# Patient Record
Sex: Female | Born: 1995 | Race: Black or African American | Hispanic: No | Marital: Single | Smoking: Former smoker
Health system: Southern US, Community
[De-identification: ages and names within clinical notes are randomized; demographics above are authoritative.]

## PROBLEM LIST (undated history)

## (undated) DIAGNOSIS — M549 Dorsalgia, unspecified: Secondary | ICD-10-CM

## (undated) DIAGNOSIS — R519 Headache, unspecified: Secondary | ICD-10-CM

## (undated) DIAGNOSIS — M2550 Pain in unspecified joint: Secondary | ICD-10-CM

## (undated) DIAGNOSIS — G932 Benign intracranial hypertension: Secondary | ICD-10-CM

## (undated) DIAGNOSIS — I10 Essential (primary) hypertension: Secondary | ICD-10-CM

## (undated) DIAGNOSIS — J302 Other seasonal allergic rhinitis: Secondary | ICD-10-CM

## (undated) HISTORY — DX: Pain in unspecified joint: M25.50

## (undated) HISTORY — DX: Benign intracranial hypertension: G93.2

## (undated) HISTORY — DX: Dorsalgia, unspecified: M54.9

## (undated) HISTORY — PX: LAPAROSCOPIC REVISION VENTRICULAR-PERITONEAL (V-P) SHUNT: SHX5924

---
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
## 2018-04-23 ENCOUNTER — Telehealth: Payer: Self-pay | Admitting: Neurology

## 2018-04-23 ENCOUNTER — Ambulatory Visit: Payer: BLUE CROSS/BLUE SHIELD | Admitting: Neurology

## 2018-04-23 NOTE — Telephone Encounter (Signed)
This patient canceled the same day of a new patient appointment. 

## 2018-04-24 ENCOUNTER — Encounter: Payer: Self-pay | Admitting: Neurology

## 2018-05-26 ENCOUNTER — Telehealth: Payer: Self-pay

## 2018-05-26 NOTE — Telephone Encounter (Signed)
Pt has a new pt appt scheduled for 06/17/18 left a vm requesting a call back to verify if she would be interested in doing a virtual video visit.

## 2018-06-16 NOTE — Addendum Note (Signed)
Addended by: Ann Maki T on: 06/16/2018 10:00 AM   Modules accepted: Orders

## 2018-06-16 NOTE — Telephone Encounter (Addendum)
I was able to reach the pt in regards to her 06/17/18 NP appt.   Pt was advised, due to current COVID 19 pandemic, our office is severely reducing in office visits for at least the next 2 weeks, in order to minimize the risk to our patients and healthcare providers.   Pt was offered a video visit on 06/17/18 at 8:30 am and accepted.   Pt understands that although there may be some limitations with this type of visit, we will take all precautions to reduce any security or privacy concerns.  Pt understands that this will be treated like an in office visit and we will file with pt's insurance, and there may be a patient responsible charge related to this service.   Pt's email is [email or web address]. Pt understands that the [employer] webex software must be downloaded and operational on the device pt plans to use for the visit.  Pt's medications, allergies and PMH have been updated.   Patient was referred by Jaymes Graff for elevated BP and history of VP shunt.

## 2018-06-17 ENCOUNTER — Ambulatory Visit (INDEPENDENT_AMBULATORY_CARE_PROVIDER_SITE_OTHER): Payer: BLUE CROSS/BLUE SHIELD | Admitting: Neurology

## 2018-06-17 ENCOUNTER — Other Ambulatory Visit: Payer: Self-pay

## 2018-06-17 ENCOUNTER — Encounter: Payer: Self-pay | Admitting: Neurology

## 2018-06-17 DIAGNOSIS — G932 Benign intracranial hypertension: Secondary | ICD-10-CM

## 2018-06-17 NOTE — Progress Notes (Signed)
     Virtual Visit via Telephone Note  I connected with Megan Wade on 06/17/18 at  8:30 AM EDT by telephone and verified that I am speaking with the correct person using two identifiers.   I discussed the limitations, risks, security and privacy concerns of performing an evaluation and management service by telephone and the availability of in person appointments. I also discussed with the patient that there may be a patient responsible charge related to this service. The patient expressed understanding and agreed to proceed.  The patient is at home, physician in the office.   History of Present Illness: Megan Wade is a 23 year old right-handed black female with a history of pseudotumor cerebri.  The patient was seen by Dr. Sharene Skeans in 2013.  At that time, the patient had extremely high opening pressures with a pressure greater than 50, she was beginning to lose vision, she was having double vision and severe headaches.  She was on birth control pills at that time.  The patient claims that she weighed about 210 pounds at that point.  She was referred to [facility] and underwent a VP shunt placement which alleviated her symptoms.  She was taken off of the birth control pills.  She has not had any recurrence of her symptoms since that time.  She does have some residual double vision that occurs while reading, otherwise she does not have double vision.  She reports no headaches at this time, she has no muffled hearing or neck stiffness.  She reports no difficulty with speech or swallowing or any dizziness.  She denies any numbness or weakness of the face, arms, legs.  There have been no problems with balance.  She is sent to this office for an evaluation.   Observations/Objective: The WebEx evaluation reveals that the patient is bright and alert.  She is answering questions appropriately.  Speech is well enunciated, not aphasic or dysarthric.  Extraocular movements are full.  The patient has ability to  protrude the tongue midline with good lateral movements of the tongue.  Good facial symmetry is seen.  The patient has good finger-nose-finger and heel shin bilaterally.  Gait is normal.  Tandem gait is normal.  Romberg is negative.  Assessment and Plan: 1.  History of pseudotumor cerebri, status post VP shunt placement  The patient is doing quite well since the VP shunt placement, she has not had any symptoms in about 7 years.  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  The patient does not require any medical therapy at this point.  She will contact our office if she believes that she is having new symptoms of headache, muffled hearing, or neck stiffness.  Follow Up Instructions: Follow-up to this office if needed.   I discussed the assessment and treatment plan with the patient. The patient was provided an opportunity to ask questions and all were answered. The patient agreed with the plan and demonstrated an understanding of the instructions.   The patient was advised to call back or seek an in-person evaluation if the symptoms worsen or if the condition fails to improve as anticipated.  I provided 30 minutes of non-face-to-face time during this encounter.   York Spaniel, MD

## 2020-01-05 ENCOUNTER — Emergency Department (HOSPITAL_COMMUNITY): Payer: Managed Care, Other (non HMO)

## 2020-01-05 ENCOUNTER — Emergency Department (HOSPITAL_COMMUNITY)
Admission: EM | Admit: 2020-01-05 | Discharge: 2020-01-06 | Disposition: A | Discharge status: Home / Self Care | Payer: Managed Care, Other (non HMO) | Attending: Emergency Medicine | Admitting: Emergency Medicine

## 2020-01-05 ENCOUNTER — Other Ambulatory Visit: Payer: Self-pay

## 2020-01-05 ENCOUNTER — Encounter (HOSPITAL_COMMUNITY): Payer: Self-pay

## 2020-01-05 DIAGNOSIS — F17290 Nicotine dependence, other tobacco product, uncomplicated: Secondary | ICD-10-CM | POA: Insufficient documentation

## 2020-01-05 DIAGNOSIS — S29012A Strain of muscle and tendon of back wall of thorax, initial encounter: Secondary | ICD-10-CM | POA: Insufficient documentation

## 2020-01-05 DIAGNOSIS — S29019A Strain of muscle and tendon of unspecified wall of thorax, initial encounter: Secondary | ICD-10-CM

## 2020-01-05 DIAGNOSIS — S299XXA Unspecified injury of thorax, initial encounter: Secondary | ICD-10-CM | POA: Diagnosis present

## 2020-01-05 LAB — POC URINE PREG, ED: Preg Test, Ur: NEGATIVE
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
## 2020-01-05 NOTE — Discharge Instructions (Addendum)
Apply ice to sore areas as needed.  Take ibuprofen and/or acetaminophen as needed for pain.

## 2020-09-01 ENCOUNTER — Other Ambulatory Visit: Payer: Self-pay

## 2020-09-01 ENCOUNTER — Emergency Department (HOSPITAL_COMMUNITY)
Admission: EM | Admit: 2020-09-01 | Discharge: 2020-09-01 | Disposition: A | Discharge status: Home / Self Care | Payer: Managed Care, Other (non HMO) | Attending: Student | Admitting: Student

## 2020-09-01 ENCOUNTER — Emergency Department (HOSPITAL_COMMUNITY): Payer: Managed Care, Other (non HMO)

## 2020-09-01 ENCOUNTER — Encounter (HOSPITAL_COMMUNITY): Payer: Self-pay | Admitting: Emergency Medicine

## 2020-09-01 DIAGNOSIS — R519 Headache, unspecified: Secondary | ICD-10-CM

## 2020-09-01 DIAGNOSIS — F17290 Nicotine dependence, other tobacco product, uncomplicated: Secondary | ICD-10-CM | POA: Insufficient documentation

## 2020-09-01 DIAGNOSIS — U071 COVID-19: Secondary | ICD-10-CM | POA: Diagnosis not present

## 2020-09-01 LAB — CBC WITH DIFFERENTIAL/PLATELET
Abs Immature Granulocytes: 0.04 10*3/uL (ref 0.00–0.07)
Basophils Absolute: 0 10*3/uL (ref 0.0–0.1)
Basophils Relative: 0 %
Eosinophils Absolute: 0 10*3/uL (ref 0.0–0.5)
Eosinophils Relative: 0 %
HCT: 40 % (ref 36.0–46.0)
Hemoglobin: 13.1 g/dL (ref 12.0–15.0)
Immature Granulocytes: 1 %
Lymphocytes Relative: 6 %
Lymphs Abs: 0.3 10*3/uL — ABNORMAL LOW (ref 0.7–4.0)
MCH: 30 pg (ref 26.0–34.0)
MCHC: 32.8 g/dL (ref 30.0–36.0)
MCV: 91.5 fL (ref 80.0–100.0)
Monocytes Absolute: 0.5 10*3/uL (ref 0.1–1.0)
Monocytes Relative: 9 %
Neutro Abs: 4.5 10*3/uL (ref 1.7–7.7)
Neutrophils Relative %: 84 %
Platelets: 217 10*3/uL (ref 150–400)
RBC: 4.37 MIL/uL (ref 3.87–5.11)
RDW: 14.2 % (ref 11.5–15.5)
WBC: 5.3 10*3/uL (ref 4.0–10.5)
nRBC: 0 % (ref 0.0–0.2)

## 2020-09-01 LAB — RESP PANEL BY RT-PCR (FLU A&B, COVID) ARPGX2
Influenza A by PCR: NEGATIVE
Influenza B by PCR: NEGATIVE
SARS Coronavirus 2 by RT PCR: POSITIVE — AB

## 2020-09-01 LAB — I-STAT BETA HCG BLOOD, ED (MC, WL, AP ONLY): I-stat hCG, quantitative: <5 m[IU]/mL (ref <5)

## 2020-09-01 LAB — BASIC METABOLIC PANEL
Anion gap: 10 (ref 5–15)
BUN: 8 mg/dL (ref 6–20)
CO2: 24 mmol/L (ref 22–32)
Calcium: 8.9 mg/dL (ref 8.9–10.3)
Chloride: 96 mmol/L — ABNORMAL LOW (ref 98–111)
Creatinine, Ser: 0.71 mg/dL (ref 0.44–1.00)
GFR, Estimated: >60 mL/min (ref >60)
Glucose, Bld: 100 mg/dL — ABNORMAL HIGH (ref 70–99)
Potassium: 3.9 mmol/L (ref 3.5–5.1)
Sodium: 130 mmol/L — ABNORMAL LOW (ref 135–145)

## 2020-09-01 MED ORDER — ACETAMINOPHEN 500 MG PO TABS
1000.0000 mg | ORAL_TABLET | Freq: Once | ORAL | Status: AC
  Administered 2020-09-01: 1000 mg via ORAL
  Filled 2020-09-01: qty 2

## 2020-09-01 MED ORDER — ACETAMINOPHEN 500 MG PO TABS: 1000.0000 mg | ORAL_TABLET | Freq: Four times a day (QID) | ORAL | 0 refills | Status: DC | PRN

## 2020-09-01 NOTE — Discharge Instructions (Addendum)
Your headache is likely due to COVID infection.  Please follow instruction below.  Recommendations for at home COVID-19 symptoms management:  Please continue isolation at home. Call [phone] to see whether you might be eligible for therapeutic antibody infusions (leave your name and they will call you back).  If have acute worsening of symptoms please go to ER/urgent care for further evaluation. Check pulse oximetry and if below 90-92% please go to ER. The following supplements MAY help:  Vitamin C 500mg  twice a day and Quercetin 250-500 mg twice a day Vitamin D3 2000 - 4000 u/day B Complex vitamins Zinc 75-100 mg/day Melatonin 6-10 mg at night (the optimal dose is unknown) Aspirin 81mg /day (if no history of bleeding issues)

## 2020-09-01 NOTE — ED Provider Notes (Signed)
Emergency Medicine Provider Triage Evaluation Note  Megan Wade , a 25 y.o. female  was evaluated in triage.  Pt complains of headache that started earlier today. Patient states headache is pressure behind bilateral eyes. She has a VP shunt that was placed about 12 years ago. No complications. Denies speech changes, unilateral weakness, and dizziness. She notes she feels warm but no documented fever. No COVID exposures.  Review of Systems  Positive: headache Negative: CP  Physical Exam  BP (!) 160/148 (BP Location: Right Arm)   Temp 98.2 F (36.8 C) (Oral)   Resp 18   Ht 5\' 9"  (1.753 m)   Wt 124.7 kg   SpO2 100%   BMI 40.61 kg/m  Gen:   Awake, no distress   Resp:  Normal effort  MSK:   Moves extremities without difficulty  Other:    Medical Decision Making  Medically screening exam initiated at 7:54 PM.  Appropriate orders placed.  Megan Wade was informed that the remainder of the evaluation will be completed by another provider, this initial triage assessment does not replace that evaluation, and the importance of remaining in the ED until their evaluation is complete.  VP shunt series to rule out complications Labs/ COVID test   Megan Wade 09/01/20 09/03/20, MD 09/04/20 [phone]

## 2020-09-01 NOTE — ED Triage Notes (Signed)
Pt reports "feeling bad" all day  Pressure behind eyes.  Hypertensive in triage.  States she has hx of htn but is not on any meds.  States she has "slept all day"  Symptoms began this morning.

## 2020-09-01 NOTE — ED Provider Notes (Addendum)
[facility] EMERGENCY DEPARTMENT Provider Note   CSN: [id number] Arrival date & time: 09/01/20  1854     History Chief Complaint  Patient presents with   Headache    Megan Wade is a 25 y.o. female.    The history is provided by the patient. No language interpreter was used.  Headache  25 year old female significant history of pseudotumor cerebri with a VP shunt that was placed 12 years ago who presents for evaluation of headache.  Patient endorsed she developed gradual onset of pain to her forehead and behind both eyes.  Pain is sharp throbbing persistent mild to moderate in severity.  She endorsed feeling a bit feverish.  She denies any double vision loss of vision runny nose sneezing coughing sore throat chest pain trouble breathing abdominal pain vomiting or diarrhea dysuria.  No specific treatment tried.  No recent sick exposure.  Past Medical History:  Diagnosis Date   Pseudotumor cerebri    Dr Josephina Gip at [facility]    Patient Active Problem List   Diagnosis Date Noted   Pseudotumor cerebri 06/17/2018    Past Surgical History:  Procedure Laterality Date   LAPAROSCOPIC REVISION VENTRICULAR-PERITONEAL (V-P) SHUNT       OB History   No obstetric history on file.     Family History  Problem Relation Age of Onset   Hypertension Mother    Hypertension Father    Cancer Maternal Grandfather    Diabetes Paternal Grandfather     Social History   Tobacco Use   Smoking status: Every Day    Types: Cigars   Smokeless tobacco: Never  Vaping Use   Vaping Use: Never used  Substance Use Topics   Alcohol use: Yes    Comment: Social    Drug use: Never    Home Medications Prior to Admission medications   Not on File    Allergies    Patient has no known allergies.  Review of Systems   Review of Systems  Neurological:  Positive for headaches.  All other systems reviewed and are negative.  Physical Exam Updated Vital Signs BP (!)  161/97 (BP Location: Right Arm)   Pulse 82   Temp 98.2 F (36.8 C) (Oral)   Resp 16   Ht 5\' 9"  (1.753 m)   Wt 124.7 kg   SpO2 100%   BMI 40.61 kg/m   Physical Exam Vitals and nursing note reviewed.  Constitutional:      General: She is not in acute distress.    Appearance: She is well-developed. She is obese.  HENT:     Head: Normocephalic and atraumatic.     Mouth/Throat:     Mouth: Mucous membranes are moist.     Pharynx: Oropharynx is clear.  Eyes:     Extraocular Movements: Extraocular movements intact.     Conjunctiva/sclera: Conjunctivae normal.     Pupils: Pupils are equal, round, and reactive to light.  Cardiovascular:     Rate and Rhythm: Normal rate and regular rhythm.     Heart sounds: Normal heart sounds.  Pulmonary:     Effort: Pulmonary effort is normal.  Abdominal:     Palpations: Abdomen is soft.  Musculoskeletal:     Cervical back: Normal range of motion and neck supple. No rigidity.  Lymphadenopathy:     Cervical: No cervical adenopathy.  Skin:    Findings: No rash.  Neurological:     Mental Status: She is alert and oriented to person, place,  and time.     GCS: GCS eye subscore is 4. GCS verbal subscore is 5. GCS motor subscore is 6.     Cranial Nerves: No cranial nerve deficit, dysarthria or facial asymmetry.     Sensory: No sensory deficit.     Motor: No weakness.  Psychiatric:        Mood and Affect: Mood normal.    ED Results / Procedures / Treatments   Labs (all labs ordered are listed, but only abnormal results are displayed) Labs Reviewed  RESP PANEL BY RT-PCR (FLU A&B, COVID) ARPGX2 - Abnormal; Notable for the following components:      Result Value   SARS Coronavirus 2 by RT PCR POSITIVE (*)    All other components within normal limits  CBC WITH DIFFERENTIAL/PLATELET - Abnormal; Notable for the following components:   Lymphs Abs 0.3 (*)    All other components within normal limits  BASIC METABOLIC PANEL - Abnormal; Notable for the  following components:   Sodium 130 (*)    Chloride 96 (*)    Glucose, Bld 100 (*)    All other components within normal limits  I-STAT BETA HCG BLOOD, ED ([facility], [facility], [facility] ONLY)    EKG None  Radiology DG Skull 1-3 Views  Result Date: 09/01/2020 CLINICAL DATA:  Evaluate for shunt malfunction. EXAM: SKULL - 1-3 VIEW COMPARISON:  01/05/2020 FINDINGS: Two view exam of the skull shows no evidence for discontinuity of the shunt catheter tubing. No evidence for skull fracture. Visualized paranasal sinuses appear clear. IMPRESSION: No evidence for discontinuity of the shunt catheter tubing. Electronically Signed   By: Kennith Center M.D.   On: 09/01/2020 21:04   DG Chest 1 View  Result Date: 09/01/2020 CLINICAL DATA:  25 year old female with VP shunt malfunction. EXAM: CHEST  1 VIEW; ABDOMEN - 1 VIEW COMPARISON:  Chest radiograph dated 01/05/2020. FINDINGS: VP shunt catheter noted over the right side of the chest and abdomen with tip in the right lower quadrant. No kinking or breakage noted in the visualized catheter. The lungs are clear. There is no pleural effusion pneumothorax. Mild cardiomegaly. No bowel dilatation or evidence of obstruction. No free air or radiopaque calculi identified. The osseous structures are intact. The soft tissues are unremarkable. IMPRESSION: Unremarkable visualized VP shunt catheter. Electronically Signed   By: Elgie Collard M.D.   On: 09/01/2020 21:04   DG Abd 1 View  Result Date: 09/01/2020 CLINICAL DATA:  25 year old female with VP shunt malfunction. EXAM: CHEST  1 VIEW; ABDOMEN - 1 VIEW COMPARISON:  Chest radiograph dated 01/05/2020. FINDINGS: VP shunt catheter noted over the right side of the chest and abdomen with tip in the right lower quadrant. No kinking or breakage noted in the visualized catheter. The lungs are clear. There is no pleural effusion pneumothorax. Mild cardiomegaly. No bowel dilatation or evidence of obstruction. No free air or radiopaque calculi  identified. The osseous structures are intact. The soft tissues are unremarkable. IMPRESSION: Unremarkable visualized VP shunt catheter. Electronically Signed   By: Elgie Collard M.D.   On: 09/01/2020 21:04    Procedures Procedures   Medications Ordered in ED Medications  acetaminophen (TYLENOL) tablet 1,000 mg (has no administration in time range)    ED Course  I have reviewed the triage vital signs and the nursing notes.  Pertinent labs & imaging results that were available during my care of the patient were reviewed by me and considered in my medical decision making (see chart for details).  MDM Rules/Calculators/A&P                          BP (!) 161/97 (BP Location: Right Arm)   Pulse 82   Temp 98.2 F (36.8 C) (Oral)   Resp 16   Ht 5\' 9"  (1.753 m)   Wt 124.7 kg   SpO2 100%   BMI 40.61 kg/m   Final Clinical Impression(s) / ED Diagnoses Final diagnoses:  Bad headache  COVID-19 virus infection    Rx / DC Orders ED Discharge Orders          Ordered    acetaminophen (TYLENOL) 500 MG tablet  Every 6 hours PRN        09/01/20 2327           11:24 PM Patient here with complaints of headache and subjective fever.  Patient overall well-appearing no nuchal rigidity concerning for meningitis.  She has no focal neurodeficit on exam concerning for stroke or space-occupying lesion.  No vision changes to suggest worsening of pseudotumor cerebri.  Screening for VP shunt including skull x-ray and chest x-ray without any concerning finding.  Patient did test positive for COVID-19.  She has not been vaccinated for COVID-19.  Suspect headache likely secondary to COVID infection.  Appropriate instruction provided with return precaution  Anneth Brunell was evaluated in Emergency Department on 09/01/2020 for the symptoms described in the history of present illness. She was evaluated in the context of the global COVID-19 pandemic, which necessitated consideration that the patient  might be at risk for infection with the SARS-CoV-2 virus that causes COVID-19. Institutional protocols and algorithms that pertain to the evaluation of patients at risk for COVID-19 are in a state of rapid change based on information released by regulatory bodies including the CDC and federal and state organizations. These policies and algorithms were followed during the patient's care in the ED.    09/03/2020, PA-C 09/01/20 2328    09/03/20, PA-C 09/01/20 2331    09/03/20, MD 09/02/20 [phone]

## 2021-06-24 ENCOUNTER — Emergency Department (HOSPITAL_COMMUNITY): Payer: Managed Care, Other (non HMO)

## 2021-06-24 ENCOUNTER — Encounter (HOSPITAL_COMMUNITY): Payer: Self-pay | Admitting: Emergency Medicine

## 2021-06-24 ENCOUNTER — Emergency Department (HOSPITAL_COMMUNITY)
Admission: EM | Admit: 2021-06-24 | Discharge: 2021-06-24 | Disposition: A | Discharge status: Home / Self Care | Payer: Managed Care, Other (non HMO) | Attending: Emergency Medicine | Admitting: Emergency Medicine

## 2021-06-24 DIAGNOSIS — R519 Headache, unspecified: Secondary | ICD-10-CM

## 2021-06-24 DIAGNOSIS — Z79899 Other long term (current) drug therapy: Secondary | ICD-10-CM | POA: Diagnosis not present

## 2021-06-24 DIAGNOSIS — R000 Tachycardia, unspecified: Secondary | ICD-10-CM | POA: Diagnosis not present

## 2021-06-24 DIAGNOSIS — J019 Acute sinusitis, unspecified: Secondary | ICD-10-CM | POA: Diagnosis not present

## 2021-06-24 DIAGNOSIS — I10 Essential (primary) hypertension: Secondary | ICD-10-CM | POA: Insufficient documentation

## 2021-06-24 LAB — BASIC METABOLIC PANEL
Anion gap: 7 (ref 5–15)
BUN: 8 mg/dL (ref 6–20)
CO2: 25 mmol/L (ref 22–32)
Calcium: 9.4 mg/dL (ref 8.9–10.3)
Chloride: 103 mmol/L (ref 98–111)
Creatinine, Ser: 0.76 mg/dL (ref 0.44–1.00)
GFR, Estimated: >60 mL/min (ref >60)
Glucose, Bld: 101 mg/dL — ABNORMAL HIGH (ref 70–99)
Potassium: 3.8 mmol/L (ref 3.5–5.1)
Sodium: 135 mmol/L (ref 135–145)

## 2021-06-24 LAB — TSH: TSH: 3.837 u[IU]/mL (ref 0.350–4.500)

## 2021-06-24 LAB — CBC WITH DIFFERENTIAL/PLATELET
Abs Immature Granulocytes: 0.03 10*3/uL (ref 0.00–0.07)
Basophils Absolute: 0 10*3/uL (ref 0.0–0.1)
Basophils Relative: 1 %
Eosinophils Absolute: 0.1 10*3/uL (ref 0.0–0.5)
Eosinophils Relative: 1 %
HCT: 41.8 % (ref 36.0–46.0)
Hemoglobin: 14.1 g/dL (ref 12.0–15.0)
Immature Granulocytes: 1 %
Lymphocytes Relative: 31 %
Lymphs Abs: 2 10*3/uL (ref 0.7–4.0)
MCH: 29.7 pg (ref 26.0–34.0)
MCHC: 33.7 g/dL (ref 30.0–36.0)
MCV: 88.2 fL (ref 80.0–100.0)
Monocytes Absolute: 0.4 10*3/uL (ref 0.1–1.0)
Monocytes Relative: 6 %
Neutro Abs: 4 10*3/uL (ref 1.7–7.7)
Neutrophils Relative %: 60 %
Platelets: 285 10*3/uL (ref 150–400)
RBC: 4.74 MIL/uL (ref 3.87–5.11)
RDW: 14.5 % (ref 11.5–15.5)
WBC: 6.5 10*3/uL (ref 4.0–10.5)
nRBC: 0 % (ref 0.0–0.2)

## 2021-06-24 LAB — I-STAT BETA HCG BLOOD, ED (MC, WL, AP ONLY): I-stat hCG, quantitative: <5 m[IU]/mL (ref <5)

## 2021-06-24 MED ORDER — CLONIDINE HCL 0.1 MG PO TABS
0.1000 mg | ORAL_TABLET | Freq: Once | ORAL | Status: AC
  Administered 2021-06-24: 0.1 mg via ORAL
  Filled 2021-06-24: qty 1

## 2021-06-24 MED ORDER — AMLODIPINE BESYLATE 5 MG PO TABS: 5.0000 mg | ORAL_TABLET | Freq: Every day | ORAL | 0 refills | Status: DC

## 2021-06-24 MED ORDER — FLUTICASONE PROPIONATE 50 MCG/ACT NA SUSP: 2.0000 | Freq: Every day | NASAL | 0 refills | Status: DC

## 2021-06-24 MED ORDER — SODIUM CHLORIDE 0.9 % IV BOLUS: 1000.0000 mL | Freq: Once | INTRAVENOUS | Status: DC

## 2021-06-24 MED ORDER — DEXAMETHASONE SODIUM PHOSPHATE 10 MG/ML IJ SOLN
10.0000 mg | Freq: Once | INTRAMUSCULAR | Status: AC
  Administered 2021-06-24: 10 mg via INTRAVENOUS
  Filled 2021-06-24: qty 1

## 2021-06-24 MED ORDER — CETIRIZINE HCL 10 MG PO TABS: 10.0000 mg | ORAL_TABLET | Freq: Every day | ORAL | 0 refills | Status: DC

## 2021-06-24 MED ORDER — METOCLOPRAMIDE HCL 5 MG/ML IJ SOLN
10.0000 mg | Freq: Once | INTRAMUSCULAR | Status: AC
  Administered 2021-06-24: 10 mg via INTRAVENOUS
  Filled 2021-06-24: qty 2

## 2021-06-24 MED ORDER — DIPHENHYDRAMINE HCL 50 MG/ML IJ SOLN
50.0000 mg | Freq: Once | INTRAMUSCULAR | Status: AC
  Administered 2021-06-24: 50 mg via INTRAVENOUS
  Filled 2021-06-24: qty 1

## 2021-06-24 NOTE — ED Notes (Signed)
Patient given sandwich and water

## 2021-06-24 NOTE — ED Triage Notes (Signed)
Patient c/o headache x6 days and hypertensive at home 185/120. Denies taking BP medications. ?

## 2021-06-24 NOTE — Discharge Instructions (Signed)
Follow-up with your OB/GYN within the next 1 to 2 weeks for recheck.  You may need to establish care with a primary care doctor to better manage her blood pressure.  Uses Zyrtec and Flonase as instructed.  Return to the emergency room if you have any worsening symptoms including fevers, worsening headache, vomiting, or other worsening symptoms. ?

## 2021-06-24 NOTE — ED Provider Notes (Signed)
?[facility]-EMERGENCY DEPT ?Provider Note ? ? ?CSN: [id number] ?Arrival date & time: 06/24/21  1617 ? ?  ? ?History ? ?Chief Complaint  ?Patient presents with  ? Hypertension  ? ? ?Megan Wade is a 26 y.o. female. ? ?Patient is a 26 year old female with a history of prior pseudotumor cerebri with VP shunt placement in 2013.  She presents with a 1 week history of ongoing headaches.  She said it was bifrontal and was bilateral previously but now is more on the right side.  She said she used a nasal rinse because she had some nasal congestion that seemed to improve the pain on the left side but she still has pain on the right side.  She says she woke up this morning it was very severe but has been waxing waning throughout the day.  She has no associated fevers.  No neck pain or stiffness.  No nausea or vomiting.  She does have a little bit of photophobia.  No history of similar symptoms in the past.  No vision changes.  No speech deficits.  No numbness or weakness to her extremities.  She has noted that her blood pressure has been elevated at home.  She does not have a known history of hypertension nor does she take blood pressure medicines. ? ? ?  ? ?Home Medications ?Prior to Admission medications   ?Medication Sig Start Date End Date Taking? Authorizing Provider  ?amLODipine (NORVASC) 5 MG tablet Take 1 tablet (5 mg total) by mouth daily. 06/24/21  Yes Rolan Bucco, MD  ?cetirizine (ZYRTEC ALLERGY) 10 MG tablet Take 1 tablet (10 mg total) by mouth daily. 06/24/21  Yes Rolan Bucco, MD  ?fluticasone (FLONASE) 50 MCG/ACT nasal spray Place 2 sprays into both nostrils daily. 06/24/21  Yes Rolan Bucco, MD  ?acetaminophen (TYLENOL) 500 MG tablet Take 2 tablets (1,000 mg total) by mouth every 6 (six) hours as needed for fever or headache. 09/01/20   Fayrene Helper, PA-C  ?   ? ?Allergies    ?Patient has no known allergies.   ? ?Review of Systems   ?Review of Systems  ?Constitutional:  Negative for chills,  diaphoresis, fatigue and fever.  ?HENT:  Positive for congestion. Negative for rhinorrhea and sneezing.   ?Eyes:  Positive for photophobia. Negative for visual disturbance.  ?Respiratory:  Negative for cough, chest tightness and shortness of breath.   ?Cardiovascular:  Negative for chest pain and leg swelling.  ?Gastrointestinal:  Negative for abdominal pain, blood in stool, diarrhea, nausea and vomiting.  ?Genitourinary:  Negative for difficulty urinating, flank pain, frequency and hematuria.  ?Musculoskeletal:  Negative for arthralgias and back pain.  ?Skin:  Negative for rash.  ?Neurological:  Positive for headaches. Negative for dizziness, speech difficulty, weakness and numbness.  ? ?Physical Exam ?Updated Vital Signs ?BP (!) 152/109   Pulse (!) 104   Temp 98.5 ?F (36.9 ?C) (Oral)   Resp (!) 23   LMP 05/21/2021   SpO2 100%  ?Physical Exam ?Constitutional:   ?   Appearance: She is well-developed.  ?HENT:  ?   Head: Normocephalic and atraumatic.  ?Eyes:  ?   Pupils: Pupils are equal, round, and reactive to light.  ?Neck:  ?   Comments: No meningismus ?Cardiovascular:  ?   Rate and Rhythm: Normal rate and regular rhythm.  ?   Heart sounds: Normal heart sounds.  ?Pulmonary:  ?   Effort: Pulmonary effort is normal. No respiratory distress.  ?   Breath  sounds: Normal breath sounds. No wheezing or rales.  ?Chest:  ?   Chest wall: No tenderness.  ?Abdominal:  ?   General: Bowel sounds are normal.  ?   Palpations: Abdomen is soft.  ?   Tenderness: There is no abdominal tenderness. There is no guarding or rebound.  ?Musculoskeletal:     ?   General: Normal range of motion.  ?   Cervical back: Normal range of motion and neck supple.  ?Lymphadenopathy:  ?   Cervical: No cervical adenopathy.  ?Skin: ?   General: Skin is warm and dry.  ?   Findings: No rash.  ?Neurological:  ?   General: No focal deficit present.  ?   Mental Status: She is alert and oriented to person, place, and time.  ?   Comments: Motor 5/5 all  extremities ?Sensation grossly intact to LT all extremities ?Finger to Nose intact, no pronator drift ?CN II-XII grossly intact ? ?  ? ? ?ED Results / Procedures / Treatments   ?Labs ?(all labs ordered are listed, but only abnormal results are displayed) ?Labs Reviewed  ?BASIC METABOLIC PANEL - Abnormal; Notable for the following components:  ?    Result Value  ? Glucose, Bld 101 (*)   ? All other components within normal limits  ?CBC WITH DIFFERENTIAL/PLATELET  ?TSH  ?I-STAT BETA HCG BLOOD, ED ([facility], [facility], [facility] ONLY)  ? ? ?EKG ?None ? ?Radiology ?DG Chest 1 View ? ?Result Date: 06/24/2021 ?CLINICAL DATA:  Evaluate for shunt malfunction.  Headache. EXAM: CHEST  1 VIEW; ABDOMEN - 1 VIEW; DG CERVICAL SPINE - 1 VIEW COMPARISON:  None Available. FINDINGS: Cervical spine: Shunt catheter tubing is seen in the right side of the neck coursing into the right chest. There is no discontinuity or kink. Chest: Shunt catheter tubing is seen projecting over the right chest coursing into the abdomen. There is no discontinuity or kink. Clear lungs. Normal heart size. Abdomen: Shunt catheter tubing courses into the right abdomen with tip in the right pelvis. There is no discontinuity or kink. Normal bowel gas pattern without obstruction. IMPRESSION: Intact shunt catheter tubing without evidence of discontinuity or kink. Tip of the catheter is in the right pelvis. Electronically Signed   By: Narda RutherfordMelanie  Sanford M.D.   On: 06/24/2021 18:13  ? ?DG Cervical Spine 1 View ? ?Result Date: 06/24/2021 ?CLINICAL DATA:  Evaluate for shunt malfunction.  Headache. EXAM: CHEST  1 VIEW; ABDOMEN - 1 VIEW; DG CERVICAL SPINE - 1 VIEW COMPARISON:  None Available. FINDINGS: Cervical spine: Shunt catheter tubing is seen in the right side of the neck coursing into the right chest. There is no discontinuity or kink. Chest: Shunt catheter tubing is seen projecting over the right chest coursing into the abdomen. There is no discontinuity or kink. Clear lungs. Normal  heart size. Abdomen: Shunt catheter tubing courses into the right abdomen with tip in the right pelvis. There is no discontinuity or kink. Normal bowel gas pattern without obstruction. IMPRESSION: Intact shunt catheter tubing without evidence of discontinuity or kink. Tip of the catheter is in the right pelvis. Electronically Signed   By: Narda RutherfordMelanie  Sanford M.D.   On: 06/24/2021 18:13  ? ?DG Abd 1 View ? ?Result Date: 06/24/2021 ?CLINICAL DATA:  Evaluate for shunt malfunction.  Headache. EXAM: CHEST  1 VIEW; ABDOMEN - 1 VIEW; DG CERVICAL SPINE - 1 VIEW COMPARISON:  None Available. FINDINGS: Cervical spine: Shunt catheter tubing is seen in the right side of the  neck coursing into the right chest. There is no discontinuity or kink. Chest: Shunt catheter tubing is seen projecting over the right chest coursing into the abdomen. There is no discontinuity or kink. Clear lungs. Normal heart size. Abdomen: Shunt catheter tubing courses into the right abdomen with tip in the right pelvis. There is no discontinuity or kink. Normal bowel gas pattern without obstruction. IMPRESSION: Intact shunt catheter tubing without evidence of discontinuity or kink. Tip of the catheter is in the right pelvis. Electronically Signed   By: Narda Rutherford M.D.   On: 06/24/2021 18:13  ? ?CT Head Wo Contrast ? ?Result Date: 06/24/2021 ?CLINICAL DATA:  Hypertension with headache x6 days. EXAM: CT HEAD WITHOUT CONTRAST TECHNIQUE: Contiguous axial images were obtained from the base of the skull through the vertex without intravenous contrast. RADIATION DOSE REDUCTION: This exam was performed according to the departmental dose-optimization program which includes automated exposure control, adjustment of the mA and/or kV according to patient size and/or use of iterative reconstruction technique. COMPARISON:  November 01, 2011 FINDINGS: Brain: No evidence of acute infarction, hemorrhage, hydrocephalus, extra-axial collection or mass lesion/mass effect. A  ventriculostomy catheter is seen entering the a right frontal burr hole. Its distal tip is seen along the midline, in between the anterior horns of the lateral ventricles. Vascular: No hyperdense vessel or unexpecte

## 2021-06-26 ENCOUNTER — Encounter (HOSPITAL_BASED_OUTPATIENT_CLINIC_OR_DEPARTMENT_OTHER): Payer: Self-pay | Admitting: Plastic Surgery

## 2021-06-27 ENCOUNTER — Other Ambulatory Visit: Payer: Self-pay

## 2021-06-27 ENCOUNTER — Encounter (HOSPITAL_BASED_OUTPATIENT_CLINIC_OR_DEPARTMENT_OTHER): Payer: Self-pay | Admitting: Plastic Surgery

## 2021-06-27 NOTE — Progress Notes (Signed)
Pt's chart and past medical history reviewed with Dr Okey Dupre. Pt to come in for EKG & BP check at PAT appt prior to day of surgery. Pt will also need Neuro clearance,  last OV 06/17/18 with Dr Anne Hahn,  ? ?The pt  presented to ED on 06/24/21 with c/o headaches x several days. Pt c/o sinus pressure and also found to be hypertensive at that time.  ? ?Marisue Ivan at Dr Sherald Hess' office aware that due to pt's neuro history clearance will need to be obtained. Pre op call done with the pt and she also verbalized understanding of plan of care.  ?

## 2021-06-28 ENCOUNTER — Encounter (HOSPITAL_BASED_OUTPATIENT_CLINIC_OR_DEPARTMENT_OTHER)
Admission: RE | Admit: 2021-06-28 | Discharge: 2021-06-28 | Disposition: A | Discharge status: Home / Self Care | Payer: Managed Care, Other (non HMO) | Source: Ambulatory Visit | Attending: Plastic Surgery | Admitting: Plastic Surgery

## 2021-06-28 DIAGNOSIS — Z01810 Encounter for preprocedural cardiovascular examination: Secondary | ICD-10-CM | POA: Diagnosis present

## 2021-06-28 NOTE — Progress Notes (Signed)
Pt here today for PAT EKG and BP check. Results reviewed by Dr. Roanna Banning, ok to proceed as planned with surgery at [facility] pending neuro clearance.  Pt already aware of need for neuro clearance. ? ?

## 2021-06-30 ENCOUNTER — Ambulatory Visit: Payer: Self-pay | Admitting: Plastic Surgery

## 2021-06-30 MED ORDER — TRANEXAMIC ACID 1000 MG/10ML IV SOLN: 1000.0000 mg | INTRAVENOUS | Status: AC

## 2021-07-04 ENCOUNTER — Ambulatory Visit (HOSPITAL_BASED_OUTPATIENT_CLINIC_OR_DEPARTMENT_OTHER): Payer: Managed Care, Other (non HMO) | Admitting: Anesthesiology

## 2021-07-04 ENCOUNTER — Other Ambulatory Visit: Payer: Self-pay

## 2021-07-04 ENCOUNTER — Encounter (HOSPITAL_BASED_OUTPATIENT_CLINIC_OR_DEPARTMENT_OTHER): Payer: Self-pay | Admitting: Plastic Surgery

## 2021-07-04 ENCOUNTER — Ambulatory Visit (HOSPITAL_BASED_OUTPATIENT_CLINIC_OR_DEPARTMENT_OTHER)
Admission: RE | Admit: 2021-07-04 | Discharge: 2021-07-04 | Disposition: A | Discharge status: Home / Self Care | Payer: Managed Care, Other (non HMO) | Attending: Plastic Surgery | Admitting: Plastic Surgery

## 2021-07-04 ENCOUNTER — Encounter (HOSPITAL_BASED_OUTPATIENT_CLINIC_OR_DEPARTMENT_OTHER)
Admission: RE | Disposition: A | Discharge status: Home / Self Care | Payer: Self-pay | Source: Home / Self Care | Attending: Plastic Surgery

## 2021-07-04 DIAGNOSIS — Z6841 Body Mass Index (BMI) 40.0 and over, adult: Secondary | ICD-10-CM

## 2021-07-04 DIAGNOSIS — I10 Essential (primary) hypertension: Secondary | ICD-10-CM | POA: Diagnosis not present

## 2021-07-04 DIAGNOSIS — Z01818 Encounter for other preprocedural examination: Secondary | ICD-10-CM

## 2021-07-04 DIAGNOSIS — N62 Hypertrophy of breast: Secondary | ICD-10-CM

## 2021-07-04 HISTORY — PX: BREAST REDUCTION WITH MASTOPEXY: SHX6465

## 2021-07-04 HISTORY — DX: Other seasonal allergic rhinitis: J30.2

## 2021-07-04 HISTORY — DX: Headache, unspecified: R51.9

## 2021-07-04 HISTORY — DX: Essential (primary) hypertension: I10

## 2021-07-04 LAB — POCT PREGNANCY, URINE: Preg Test, Ur: NEGATIVE

## 2021-07-04 SURGERY — MAMMOPLASTY, REDUCTION, WITH MASTOPEXY
Anesthesia: General | Site: Breast | Laterality: Bilateral

## 2021-07-04 MED ORDER — CHLORHEXIDINE GLUCONATE CLOTH 2 % EX PADS: 6.0000 | MEDICATED_PAD | Freq: Once | CUTANEOUS | Status: DC

## 2021-07-04 MED ORDER — ONDANSETRON HCL 4 MG/2ML IJ SOLN
INTRAMUSCULAR | Status: AC
  Filled 2021-07-04: qty 8

## 2021-07-04 MED ORDER — LIDOCAINE HCL (CARDIAC) PF 100 MG/5ML IV SOSY
PREFILLED_SYRINGE | INTRAVENOUS | Status: DC | PRN
Start: 2021-07-04 — End: 2021-07-04
  Administered 2021-07-04: 60 mg via INTRAVENOUS

## 2021-07-04 MED ORDER — DEXMEDETOMIDINE (PRECEDEX) IN NS 20 MCG/5ML (4 MCG/ML) IV SYRINGE
PREFILLED_SYRINGE | INTRAVENOUS | Status: AC
  Filled 2021-07-04: qty 5

## 2021-07-04 MED ORDER — 0.9 % SODIUM CHLORIDE (POUR BTL) OPTIME
TOPICAL | Status: DC | PRN
  Administered 2021-07-04: 1000 mL

## 2021-07-04 MED ORDER — BACITRACIN ZINC 500 UNIT/GM EX OINT
TOPICAL_OINTMENT | CUTANEOUS | Status: DC | PRN
  Administered 2021-07-04: 1 via TOPICAL

## 2021-07-04 MED ORDER — PHENYLEPHRINE 80 MCG/ML (10ML) SYRINGE FOR IV PUSH (FOR BLOOD PRESSURE SUPPORT)
PREFILLED_SYRINGE | INTRAVENOUS | Status: AC
  Filled 2021-07-04: qty 20

## 2021-07-04 MED ORDER — OXYCODONE HCL 5 MG PO TABS: 5.0000 mg | ORAL_TABLET | Freq: Once | ORAL | Status: DC | PRN

## 2021-07-04 MED ORDER — CEFAZOLIN IN SODIUM CHLORIDE 3-0.9 GM/100ML-% IV SOLN
INTRAVENOUS | Status: AC
  Filled 2021-07-04: qty 100

## 2021-07-04 MED ORDER — HYDROMORPHONE HCL 1 MG/ML IJ SOLN
INTRAMUSCULAR | Status: AC
  Filled 2021-07-04: qty 0.5

## 2021-07-04 MED ORDER — PROPOFOL 10 MG/ML IV BOLUS
INTRAVENOUS | Status: DC | PRN
Start: 2021-07-04 — End: 2021-07-04
  Administered 2021-07-04: 150 mg via INTRAVENOUS

## 2021-07-04 MED ORDER — LACTATED RINGERS IV SOLN: INTRAVENOUS | Status: DC

## 2021-07-04 MED ORDER — FENTANYL CITRATE (PF) 100 MCG/2ML IJ SOLN
INTRAMUSCULAR | Status: AC
  Filled 2021-07-04: qty 2

## 2021-07-04 MED ORDER — PROMETHAZINE HCL 25 MG/ML IJ SOLN: 6.2500 mg | INTRAMUSCULAR | Status: DC | PRN

## 2021-07-04 MED ORDER — MIDAZOLAM HCL 2 MG/2ML IJ SOLN
INTRAMUSCULAR | Status: AC
  Filled 2021-07-04: qty 2

## 2021-07-04 MED ORDER — PROPOFOL 500 MG/50ML IV EMUL
INTRAVENOUS | Status: DC | PRN
  Administered 2021-07-04: 25 ug/kg/min via INTRAVENOUS

## 2021-07-04 MED ORDER — MEPERIDINE HCL 25 MG/ML IJ SOLN: 6.2500 mg | INTRAMUSCULAR | Status: DC | PRN

## 2021-07-04 MED ORDER — EPHEDRINE 5 MG/ML INJ
INTRAVENOUS | Status: AC
  Filled 2021-07-04: qty 10

## 2021-07-04 MED ORDER — OXYCODONE HCL 5 MG/5ML PO SOLN: 5.0000 mg | Freq: Once | ORAL | Status: DC | PRN

## 2021-07-04 MED ORDER — CEFAZOLIN SODIUM-DEXTROSE 2-4 GM/100ML-% IV SOLN: 2.0000 g | INTRAVENOUS | Status: DC

## 2021-07-04 MED ORDER — TRANEXAMIC ACID-NACL 1000-0.7 MG/100ML-% IV SOLN
INTRAVENOUS | Status: AC
  Filled 2021-07-04: qty 100

## 2021-07-04 MED ORDER — SODIUM CHLORIDE (PF) 0.9 % IJ SOLN
INTRAMUSCULAR | Status: DC | PRN
  Administered 2021-07-04: 60 mL

## 2021-07-04 MED ORDER — CEFAZOLIN IN SODIUM CHLORIDE 3-0.9 GM/100ML-% IV SOLN
3.0000 g | Freq: Once | INTRAVENOUS | Status: AC
Start: 2021-07-04 — End: 2021-07-04
  Administered 2021-07-04: 3 g via INTRAVENOUS

## 2021-07-04 MED ORDER — AMISULPRIDE (ANTIEMETIC) 5 MG/2ML IV SOLN: 10.0000 mg | Freq: Once | INTRAVENOUS | Status: DC | PRN

## 2021-07-04 MED ORDER — DEXAMETHASONE SODIUM PHOSPHATE 10 MG/ML IJ SOLN
INTRAMUSCULAR | Status: AC
  Filled 2021-07-04: qty 2

## 2021-07-04 MED ORDER — SODIUM CHLORIDE 0.9 % IV SOLN
INTRAVENOUS | Status: DC | PRN
  Administered 2021-07-04: 120 mL

## 2021-07-04 MED ORDER — HYDROMORPHONE HCL 1 MG/ML IJ SOLN
0.2500 mg | INTRAMUSCULAR | Status: DC | PRN
  Administered 2021-07-04 (×3): 0.5 mg via INTRAVENOUS

## 2021-07-04 MED ORDER — DEXAMETHASONE SODIUM PHOSPHATE 4 MG/ML IJ SOLN
INTRAMUSCULAR | Status: DC | PRN
  Administered 2021-07-04: 5 mg via INTRAVENOUS

## 2021-07-04 MED ORDER — LIDOCAINE 2% (20 MG/ML) 5 ML SYRINGE
INTRAMUSCULAR | Status: AC
  Filled 2021-07-04: qty 10

## 2021-07-04 MED ORDER — BUPIVACAINE LIPOSOME 1.3 % IJ SUSP
INTRAMUSCULAR | Status: AC
  Filled 2021-07-04: qty 20

## 2021-07-04 MED ORDER — PROPOFOL 500 MG/50ML IV EMUL
INTRAVENOUS | Status: AC
  Filled 2021-07-04: qty 200

## 2021-07-04 MED ORDER — MIDAZOLAM HCL 5 MG/5ML IJ SOLN
INTRAMUSCULAR | Status: DC | PRN
  Administered 2021-07-04: 2 mg via INTRAVENOUS

## 2021-07-04 MED ORDER — SUGAMMADEX SODIUM 200 MG/2ML IV SOLN
INTRAVENOUS | Status: DC | PRN
  Administered 2021-07-04: 200 mg via INTRAVENOUS

## 2021-07-04 MED ORDER — ROCURONIUM BROMIDE 10 MG/ML (PF) SYRINGE
PREFILLED_SYRINGE | INTRAVENOUS | Status: AC
  Filled 2021-07-04: qty 20

## 2021-07-04 MED ORDER — FENTANYL CITRATE (PF) 100 MCG/2ML IJ SOLN
INTRAMUSCULAR | Status: AC
Start: 2021-07-04 — End: ?
  Filled 2021-07-04: qty 2

## 2021-07-04 MED ORDER — ONDANSETRON HCL 4 MG/2ML IJ SOLN
INTRAMUSCULAR | Status: DC | PRN
  Administered 2021-07-04: 4 mg via INTRAVENOUS

## 2021-07-04 MED ORDER — ROCURONIUM BROMIDE 100 MG/10ML IV SOLN
INTRAVENOUS | Status: DC | PRN
  Administered 2021-07-04: 10 mg via INTRAVENOUS
  Administered 2021-07-04: 20 mg via INTRAVENOUS
  Administered 2021-07-04: 30 mg via INTRAVENOUS
  Administered 2021-07-04: 80 mg via INTRAVENOUS
  Administered 2021-07-04: 10 mg via INTRAVENOUS

## 2021-07-04 MED ORDER — FENTANYL CITRATE (PF) 100 MCG/2ML IJ SOLN
INTRAMUSCULAR | Status: DC | PRN
  Administered 2021-07-04 (×8): 50 ug via INTRAVENOUS

## 2021-07-04 SURGICAL SUPPLY — 68 items
BAG DECANTER FOR FLEXI CONT (MISCELLANEOUS) ×6 IMPLANT
BLADE KNIFE PERSONA 10 (BLADE) ×16 IMPLANT
BLADE KNIFE PERSONA 15 (BLADE) ×10 IMPLANT
BNDG GAUZE ELAST 4 BULKY (GAUZE/BANDAGES/DRESSINGS) ×8 IMPLANT
CANISTER SUCT 1200ML W/VALVE (MISCELLANEOUS) ×4 IMPLANT
CAP BOUFFANT 24 BLUE NURSES (PROTECTIVE WEAR) ×4 IMPLANT
COVER BACK TABLE 60X90IN (DRAPES) ×2 IMPLANT
COVER MAYO STAND STRL (DRAPES) ×4 IMPLANT
DRAIN CHANNEL 10F 3/8 F FF (DRAIN) ×8 IMPLANT
DRAPE LAPAROSCOPIC ABDOMINAL (DRAPES) ×4 IMPLANT
DRSG EMULSION OIL 3X3 NADH (GAUZE/BANDAGES/DRESSINGS) ×8 IMPLANT
DRSG PAD ABDOMINAL 8X10 ST (GAUZE/BANDAGES/DRESSINGS) ×12 IMPLANT
ELECT BLADE 4.0 EZ CLEAN MEGAD (MISCELLANEOUS)
ELECT REM PT RETURN 9FT ADLT (ELECTROSURGICAL) ×3
ELECTRODE BLDE 4.0 EZ CLN MEGD (MISCELLANEOUS) ×2 IMPLANT
ELECTRODE REM PT RTRN 9FT ADLT (ELECTROSURGICAL) ×3 IMPLANT
EVACUATOR SILICONE 100CC (DRAIN) ×8 IMPLANT
GAUZE SPONGE 4X4 12PLY STRL (GAUZE/BANDAGES/DRESSINGS) ×8 IMPLANT
GLOVE BIO SURGEON STRL SZ7 (GLOVE) ×4 IMPLANT
GLOVE BIOGEL PI IND STRL 7.0 (GLOVE) ×4 IMPLANT
GLOVE BIOGEL PI INDICATOR 7.0 (GLOVE) ×4
GLOVE SURG SS PI 7.0 STRL IVOR (GLOVE) ×6 IMPLANT
GOWN STRL REUS W/ TWL LRG LVL3 (GOWN DISPOSABLE) ×9 IMPLANT
GOWN STRL REUS W/TWL LRG LVL3 (GOWN DISPOSABLE) ×15
HIBICLENS CHG 4% 4OZ BTL (MISCELLANEOUS) ×8 IMPLANT
IV NS 250ML (IV SOLUTION)
IV NS 250ML BAXH (IV SOLUTION) ×2 IMPLANT
NDL HYPO 25X1 1.5 SAFETY (NEEDLE) ×6 IMPLANT
NDL KEITH (NEEDLE) IMPLANT
NDL SAFETY ECLIPSE 18X1.5 (NEEDLE) ×2 IMPLANT
NDL SPNL 18GX3.5 QUINCKE PK (NEEDLE) ×2 IMPLANT
NEEDLE HYPO 18GX1.5 SHARP (NEEDLE)
NEEDLE HYPO 25X1 1.5 SAFETY (NEEDLE) ×9 IMPLANT
NEEDLE KEITH (NEEDLE) IMPLANT
NEEDLE SPNL 18GX3.5 QUINCKE PK (NEEDLE) ×3 IMPLANT
NS IRRIG 1000ML POUR BTL (IV SOLUTION) ×10 IMPLANT
PACK BASIN DAY SURGERY FS (CUSTOM PROCEDURE TRAY) ×4 IMPLANT
PACK UNIVERSAL I (CUSTOM PROCEDURE TRAY) ×4 IMPLANT
PAD FOAM SILICONE BACKED (GAUZE/BANDAGES/DRESSINGS) ×2 IMPLANT
PENCIL SMOKE EVACUATOR (MISCELLANEOUS) ×2 IMPLANT
PIN SAFETY STERILE (MISCELLANEOUS) ×4 IMPLANT
SCRUB TECHNI CARE 4 OZ NO DYE (MISCELLANEOUS) ×2 IMPLANT
SHEET MEDIUM DRAPE 40X70 STRL (DRAPES) IMPLANT
SLEEVE SCD COMPRESS KNEE MED (STOCKING) ×4 IMPLANT
SPIKE FLUID TRANSFER (MISCELLANEOUS) ×4 IMPLANT
SPONGE T-LAP 18X18 ~~LOC~~+RFID (SPONGE) ×14 IMPLANT
STAPLER VISISTAT 35W (STAPLE) ×8 IMPLANT
STRIP CLOSURE SKIN 1/2X4 (GAUZE/BANDAGES/DRESSINGS) ×16 IMPLANT
SUT ETHILON 3 0 PS 1 (SUTURE) ×4 IMPLANT
SUT GORETEX TH-18 36 INCH (SUTURE) IMPLANT
SUT MNCRL AB 3-0 PS2 18 (SUTURE) ×18 IMPLANT
SUT MNCRL AB 4-0 PS2 18 (SUTURE) ×8 IMPLANT
SUT MON AB 5-0 PS2 18 (SUTURE) ×8 IMPLANT
SUT PROLENE 2 0 CT2 30 (SUTURE) ×4 IMPLANT
SUT PROLENE 3 0 PS 1 (SUTURE) ×8 IMPLANT
SUT VLOC 90 P-14 23 (SUTURE) ×8 IMPLANT
SYR 20ML LL LF (SYRINGE) ×4 IMPLANT
SYR BULB IRRIG 60ML STRL (SYRINGE) ×8 IMPLANT
SYR CONTROL 10ML LL (SYRINGE) ×8 IMPLANT
TAPE MEASURE VINYL STERILE (MISCELLANEOUS) ×4 IMPLANT
TOWEL GREEN STERILE FF (TOWEL DISPOSABLE) ×12 IMPLANT
TRAY DSU PREP LF (CUSTOM PROCEDURE TRAY) ×4 IMPLANT
TRAY FOL W/BAG SLVR 16FR STRL (SET/KITS/TRAYS/PACK) ×2 IMPLANT
TRAY FOLEY W/BAG SLVR 14FR LF (SET/KITS/TRAYS/PACK) ×2 IMPLANT
TRAY FOLEY W/BAG SLVR 16FR LF (SET/KITS/TRAYS/PACK)
TUBE CONNECTING 20X1/4 (TUBING) ×4 IMPLANT
UNDERPAD 30X36 HEAVY ABSORB (UNDERPADS AND DIAPERS) ×8 IMPLANT
YANKAUER SUCT BULB TIP NO VENT (SUCTIONS) ×4 IMPLANT

## 2021-07-04 NOTE — Anesthesia Postprocedure Evaluation (Signed)
Anesthesia Post Note ? ?Patient: Megan Wade ? ?Procedure(s) Performed: BILATERAL BREAST REDUCTION WITH MAMMOPLASTY (Bilateral: Breast) ? ?  ? ?Anesthesia Type: General ?Anesthetic complications: no ? ? ?No notable events documented. ? ?Last Vitals:  ?Vitals:  ? 07/04/21 1545 07/04/21 1600  ?BP: 136/81 121/81  ?Pulse: (!) 106 (!) 106  ?Resp: 20 19  ?Temp:    ?SpO2: 97% 96%  ?  ?Last Pain:  ?Vitals:  ? 07/04/21 1611  ?TempSrc:   ?PainSc: Asleep  ? ? ?  ?  ?  ?  ?  ?  ? ?[name] ? ? ? ? ?

## 2021-07-04 NOTE — H&P (Signed)
  H&P faxed to surgical center.  -History and Physical Reviewed  -Patient has been re-examined  -No change in the plan of care  Megan Wade    

## 2021-07-04 NOTE — Anesthesia Preprocedure Evaluation (Signed)
Anesthesia Evaluation  ?Patient identified by MRN, date of birth, ID band ?Patient awake ? ? ? ?Reviewed: ?Allergy & Precautions, NPO status , Patient's Chart, lab work & pertinent test results ? ?Airway ?Mallampati: II ? ?TM Distance: >3 FB ?Neck ROM: Full ? ? ? Dental ?no notable dental hx. ? ?  ?Pulmonary ?neg pulmonary ROS, former smoker,  ?  ?Pulmonary exam normal ?breath sounds clear to auscultation ? ? ? ? ? ? Cardiovascular ?hypertension, Pt. on medications ?negative cardio ROS ?Normal cardiovascular exam ?Rhythm:Regular Rate:Normal ? ? ?  ?Neuro/Psych ? Headaches, negative psych ROS  ? GI/Hepatic ?negative GI ROS, Neg liver ROS,   ?Endo/Other  ?Morbid obesity ? Renal/GU ?negative Renal ROS  ?negative genitourinary ?  ?Musculoskeletal ?negative musculoskeletal ROS ?(+)  ? Abdominal ?(+) + obese,   ?Peds ?negative pediatric ROS ?(+)  Hematology ?negative hematology ROS ?(+)   ?Anesthesia Other Findings ? ? Reproductive/Obstetrics ?negative OB ROS ? ?  ? ? ? ? ? ? ? ? ? ? ? ? ? ?  ?  ? ? ? ? ? ? ? ? ?Anesthesia Physical ?Anesthesia Plan ? ?ASA: 3 ? ?Anesthesia Plan: General  ? ?Post-op Pain Management: Dilaudid IV, Ofirmev IV (intra-op)* and Toradol IV (intra-op)*  ? ?Induction: Intravenous ? ?PONV Risk Score and Plan: 3 and Ondansetron, Dexamethasone, Midazolam and Treatment may vary due to age or medical condition ? ?Airway Management Planned: Oral ETT ? ?Additional Equipment:  ? ?Intra-op Plan:  ? ?Post-operative Plan: Extubation in OR ? ?Informed Consent: I have reviewed the patients History and Physical, chart, labs and discussed the procedure including the risks, benefits and alternatives for the proposed anesthesia with the patient or authorized representative who has indicated his/her understanding and acceptance.  ? ? ? ?Dental advisory given ? ?Plan Discussed with: CRNA ? ?Anesthesia Plan Comments:   ? ? ? ? ? ? ?Anesthesia Quick Evaluation ? ?

## 2021-07-04 NOTE — Anesthesia Postprocedure Evaluation (Signed)
Anesthesia Post Note ? ?Patient: Megan Wade ? ?Procedure(s) Performed: BILATERAL BREAST REDUCTION WITH MAMMOPLASTY (Bilateral: Breast) ? ?  ? ?Patient location during evaluation: PACU ?Anesthesia Type: General ?Level of consciousness: awake and alert ?Pain management: pain level controlled ?Vital Signs Assessment: post-procedure vital signs reviewed and stable ?Respiratory status: spontaneous breathing, nonlabored ventilation, respiratory function stable and patient connected to nasal cannula oxygen ?Cardiovascular status: blood pressure returned to baseline and stable ?Postop Assessment: no apparent nausea or vomiting ?Anesthetic complications: no ? ? ?No notable events documented. ? ?Last Vitals:  ?Vitals:  ? 07/04/21 1545 07/04/21 1600  ?BP: 136/81 121/81  ?Pulse: (!) 106 (!) 106  ?Resp: 20 19  ?Temp:    ?SpO2: 97% 96%  ?  ?Last Pain:  ?Vitals:  ? 07/04/21 1611  ?TempSrc:   ?PainSc: Asleep  ? ? ?  ?  ?  ?  ?  ?  ? ?[name] ? ? ? ? ?

## 2021-07-04 NOTE — Brief Op Note (Signed)
07/04/2021 ? ?2:32 PM ? ?PATIENT:  Megan Wade  26 y.o. female ? ?PRE-OPERATIVE DIAGNOSIS:  HYPERTROPHY OF BILATERAL BREASTS ? ?POST-OPERATIVE DIAGNOSIS:  HYPERTROPHY OF BILATERAL BREASTS ? ?PROCEDURE:  Procedure(s): ?BILATERAL BREAST REDUCTION WITH MAMMOPLASTY (Bilateral) ? ?SURGEON:  Surgeon(s) and Role: ?   * Lajune Perine, Audrea Muscat, MD - Primary ? ?ANESTHESIA:   general ? ?EBL:  70 mL  ? ?BLOOD ADMINISTERED:none ? ?DRAINS: (78F) Jackson-Pratt drain(s) with closed bulb suction in the Bilateral Breasts   ? ?LOCAL MEDICATIONS USED:  OTHER 1.3% Exparel (266 mgs. Total) ? ?SPECIMEN:  Source of Specimen:  Bilateral Breasts ? ?DISPOSITION OF SPECIMEN:  PATHOLOGY ? ?COUNTS:  YES ? ?DICTATION: .Note written in EPIC ? ?PLAN OF CARE: Discharge to home after PACU ? ?PATIENT DISPOSITION:  PACU - hemodynamically stable. ?  ?Delay start of Pharmacological VTE agent (>24hrs) due to surgical blood loss or risk of bleeding: not applicable ? ?

## 2021-07-04 NOTE — Op Note (Signed)
OPERATIVE REPORT ? ?07/04/2021 ? ?Megan Wade ? ?PREOPERATIVE DIAGNOSIS:  Bilateral Macromastia. ? ?POSTOPERATIVE DIAGNOSIS:  Bilateral Macromastia. ? ?PROCEDURE:  Bilateral Reduction Mammoplasties. ? ?ATTENDING SURGEON:  Youlanda Roys, MD ? ?ANESTHESIA:  General. ? ?ANESTHESIOLOGISTBrooke Dare, MD ? ?COMPLICATIONS:  None. ? ?INDICATIONS FOR THE PROCEDURE:  The patient is a 26 y.o. female who ?has Bilateral Macromastia that is clinically symptomatic.  She presents ?to undergo Bilateral Reduction Mammoplasties. ? ?DESCRIPTION OF PROCEDURE:  The patient was marked in preop holding area ?in a pattern of Wise for the future bilateral reduction mammoplasties. ?She was then taken back to the OR, placed on the table in supine ?position.  After adequate general anesthesia was obtained, the patient's ?chest was prepped with Techni-Care and draped in sterile fashion.  The ?bases of the breasts have been infiltrated with 1% lidocaine with ?epinephrine.  After adequate hemostasis and anesthesia taken effect, the ?procedure was begun. ? ?Both of the breast reductions were performed in the following similar ?manner.  The nipple-areolar complex was marked with a 45-mm nipple ?marker.  The skin was then incised and deepithelialized around the ?nipple-areolar complex down to the inframammary crease in the inferior ?pedicle pattern.  Next, the medial, superior, and lateral skin flaps ?were elevated down to the chest wall.  Excess fat and glandular tissue ?removed from the inferior pedicle.  The nipple-areolar complex was ?examined and found to be pink and viable.  The wound was irrigated with ?saline irrigation.  Meticulous hemostasis was obtained with the Bovie ?electrocautery.  Inferior pedicle was centralized using 3-0 Prolene ?suture.  A #10 JP flat fully fluted drain was placed into the wound. ?The skin flaps were brought together at the inverted T junction with a 2- ?0 Prolene suture.  The incisions were stapled for  temporary closure. ?The breasts compared and found to have good shape and symmetry.  The ?incisions were then closed from the medial aspect of the JP drain to the ?medial aspect of the [facility] incision by first placing a few 3-0 Monocryl ?sutures to tack together the dermal layer, and then both the dermal and ?cuticular layer were closed in a single layer using a 3-0 V-lock ?barbed suture.  Lateral to the JP drain incision was closed using 3-0 ?Monocryl in the dermal layer, followed by 3-0 Monocryl running ?intracuticular stitch on the skin.  The vertical limb of the Wise ?pattern was closed in the dermal layer using 3-0 Monocryl suture. ? ?The patient was placed in the upright position.  The future location of ?the nipple-areolar complexes was marked on both breast mounds using the ?45-mm nipple marker.  She was then placed back in the recumbent ?position. ? ?Both of the nipple areolar complexes were brought out onto the breast ?mounds in the following similar manner.  The skin was incised as marked ?and removed in full thickness into the subcutaneous tissues.  The nipple- ?areolar complex was examined, found to be pink and viable, then brought ?out through this aperture and sewn in place using 4-0 Monocryl in the ?dermal layer, followed by 5-0 Monocryl running intracuticular stitch on ?the skin.  This 5-0 Monocryl suture was then brought down to close the ?cuticular layer of the vertical limb as well.  The JP drain was sewn in ?place using 3-0 nylon suture.  The pectoralis major muscle and fascia ?along with the breast and chest soft tissues were then infiltrated with ?1% Exparel (total 266 mg).  Now the [facility] incision was also infiltrated ?with  the Exparel in order to give the patient postoperative pain ?control.  The incisions were dressed with Steri-Strips and the ?nipples dressed with Bacitracin ointment and Adaptic.  4x4s were placed ?over the incisions and ABD pads in the axillary areas.  The patient was ?placed  into a light postoperative support bra.  There were no complications. ?The patient tolerated the procedure well.  The final needle, sponge ?counts were reported to be correct at the end of the case. ? ?The patient was then recovered without complications.  Both the patient ?and her family were given proper postoperative wound care instructions. ?She was then discharged home in the care of her family in stable ?condition.  Follow up will be with me in a few days in the office. ? ? ? ?     ?Youlanda Roys, M.D. ? ?07/04/2021 ?2:36 PM ? ? ? ?  ?

## 2021-07-04 NOTE — Anesthesia Procedure Notes (Signed)
Procedure Name: Intubation ?Date/Time: 07/04/2021 9:52 AM ?Performed by: Signe Colt, CRNA ?Pre-anesthesia Checklist: Patient identified, Emergency Drugs available, Suction available and Patient being monitored ?Patient Re-evaluated:Patient Re-evaluated prior to induction ?Oxygen Delivery Method: Circle system utilized ?Preoxygenation: Pre-oxygenation with 100% oxygen ?Induction Type: IV induction ?Ventilation: Mask ventilation without difficulty ?Laryngoscope Size: Mac and 3 ?Grade View: Grade I ?Tube type: Oral ?Tube size: 7.0 mm ?Number of attempts: 1 ?Airway Equipment and Method: Stylet and Oral airway ?Placement Confirmation: ETT inserted through vocal cords under direct vision, positive ETCO2 and breath sounds checked- equal and bilateral ?Secured at: 21 cm ?Tube secured with: Tape ?Dental Injury: Teeth and Oropharynx as per pre-operative assessment  ? ? ? ? ?

## 2021-07-04 NOTE — Discharge Instructions (Addendum)
1. No lifting greater than 5 lbs with arms for 4 weeks. 2. Empty, strip, record and reactivate JP drains 3 times a day. 3. Percocet 5/325 mg tabs 1-2 tabs po q 4-6 hours prn pain- prescription given in office. 4. Duricef 1 tab po bid- prescription given in office. 5. Sterapred dose pack as directed- prescription given in office. 6. Follow-up appointment Friday in office.     Post Anesthesia Home Care Instructions  Activity: Get plenty of rest for the remainder of the day. A responsible individual must stay with you for 24 hours following the procedure.  For the next 24 hours, DO NOT: -Drive a car -Operate machinery -Drink alcoholic beverages -Take any medication unless instructed by your physician -Make any legal decisions or sign important papers.  Meals: Start with liquid foods such as gelatin or soup. Progress to regular foods as tolerated. Avoid greasy, spicy, heavy foods. If nausea and/or vomiting occur, drink only clear liquids until the nausea and/or vomiting subsides. Call your physician if vomiting continues.  Special Instructions/Symptoms: Your throat may feel dry or sore from the anesthesia or the breathing tube placed in your throat during surgery. If this causes discomfort, gargle with warm salt water. The discomfort should disappear within 24 hours.  If you had a scopolamine patch placed behind your ear for the management of post- operative nausea and/or vomiting:  1. The medication in the patch is effective for 72 hours, after which it should be removed.  Wrap patch in a tissue and discard in the trash. Wash hands thoroughly with soap and water. 2. You may remove the patch earlier than 72 hours if you experience unpleasant side effects which may include dry mouth, dizziness or visual disturbances. 3. Avoid touching the patch. Wash your hands with soap and water after contact with the patch.  Information for Discharge Teaching: EXPAREL (bupivacaine liposome injectable  suspension)   Your surgeon or anesthesiologist gave you EXPAREL(bupivacaine) to help control your pain after surgery.  EXPAREL is a local anesthetic that provides pain relief by numbing the tissue around the surgical site. EXPAREL is designed to release pain medication over time and can control pain for up to 72 hours. Depending on how you respond to EXPAREL, you may require less pain medication during your recovery.  Possible side effects: Temporary loss of sensation or ability to move in the area where bupivacaine was injected. Nausea, vomiting, constipation Rarely, numbness and tingling in your mouth or lips, lightheadedness, or anxiety may occur. Call your doctor right away if you think you may be experiencing any of these sensations, or if you have other questions regarding possible side effects.  Follow all other discharge instructions given to you by your surgeon or nurse. Eat a healthy diet and drink plenty of water or other fluids.  If you return to the hospital for any reason within 96 hours following the administration of EXPAREL, it is important for health care providers to know that you have received this anesthetic. A teal colored band has been placed on your arm with the date, time and amount of EXPAREL you have received in order to alert and inform your health care providers. Please leave this armband in place for the full 96 hours following administration, and then you may remove the band.          JP Drain Totals Bring this sheet to all of your post-operative appointments while you have your drains. Please measure your drains by CC's or ML's. Make sure   you drain and measure your JP Drains 3 times per day. At the end of each day, add up totals for the left side and add up totals for the right side.    ( 9 am )     ( 3 pm )        ( 9 pm )                Date L  R  L  R  L  R  Total L/R                                                                                                                                                                                        

## 2021-07-04 NOTE — Transfer of Care (Signed)
Immediate Anesthesia Transfer of Care Note ? ?Patient: Megan Wade ? ?Procedure(s) Performed: BILATERAL BREAST REDUCTION WITH MAMMOPLASTY (Bilateral: Breast) ? ?Patient Location: PACU ? ?Anesthesia Type:General ? ?Level of Consciousness: awake ? ?Airway & Oxygen Therapy: Patient Spontanous Breathing and Patient connected to face mask oxygen ? ?Post-op Assessment: Report given to RN and Post -op Vital signs reviewed and stable ? ?Post vital signs: Reviewed and stable ? ?Last Vitals:  ?Vitals Value Taken Time  ?BP    ?Temp    ?Pulse    ?Resp    ?SpO2    ? ? ?Last Pain:  ?Vitals:  ? 07/04/21 0757  ?TempSrc: Oral  ?PainSc: 0-No pain  ?   ? ?Patients Stated Pain Goal: 7 (07/04/21 0757) ? ?Complications: No notable events documented. ?

## 2021-07-05 ENCOUNTER — Encounter (HOSPITAL_BASED_OUTPATIENT_CLINIC_OR_DEPARTMENT_OTHER): Payer: Self-pay | Admitting: Plastic Surgery

## 2021-07-06 LAB — SURGICAL PATHOLOGY

## 2023-02-24 IMAGING — CT CT HEAD W/O CM
3 series · 15 of 47 positions shown, 18 images · non-contrast
Comparison: November 01, 2011

CLINICAL DATA: Hypertension with headache x6 days.



[Series 2: head wo · axial · 0.48mm/px · z∈[-185,-55]mm · 9 of 32 slices shown, 12 images]
[im 3/32  brain]
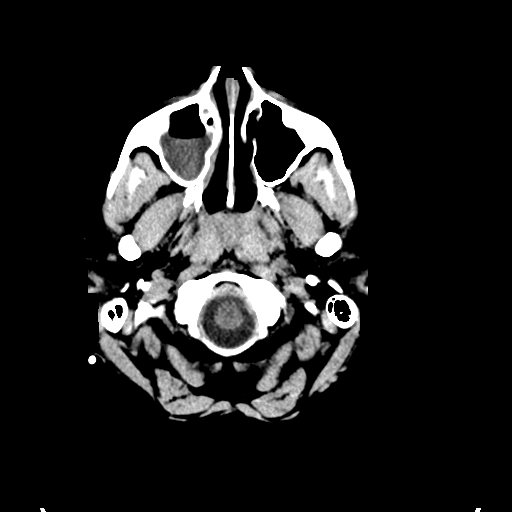
[im 3/32  bone]
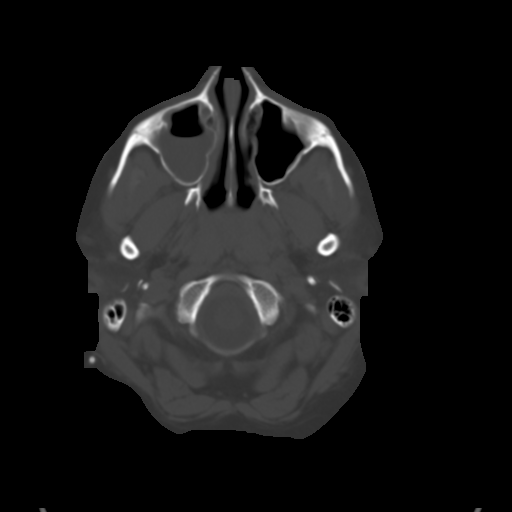
[im 6/32  brain]
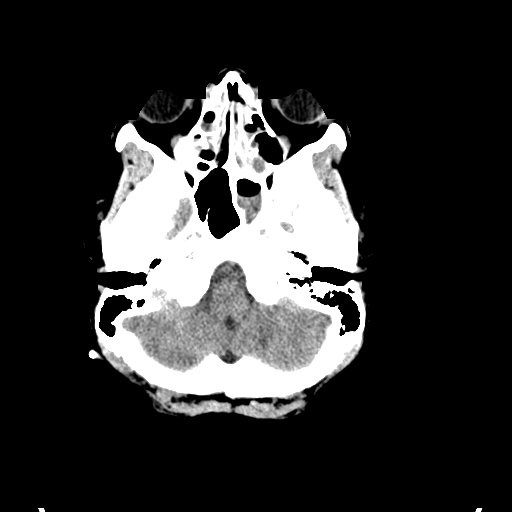
[im 9/32  brain]
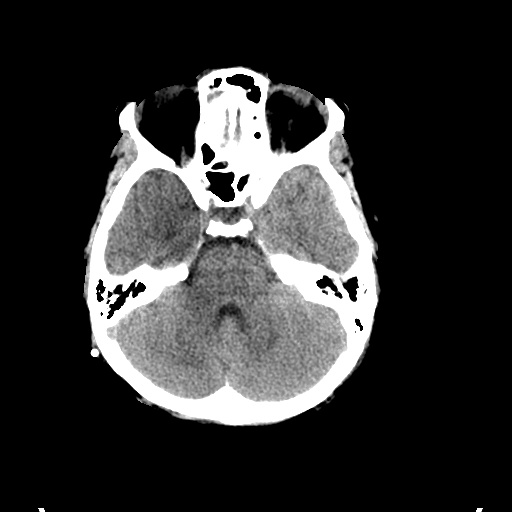
[im 12/32  brain]
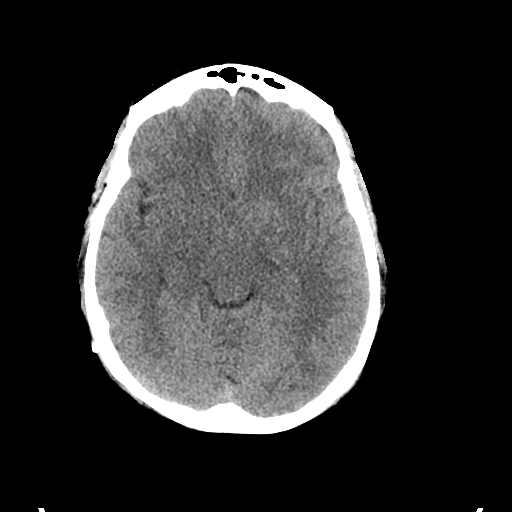
[im 17/32  brain]
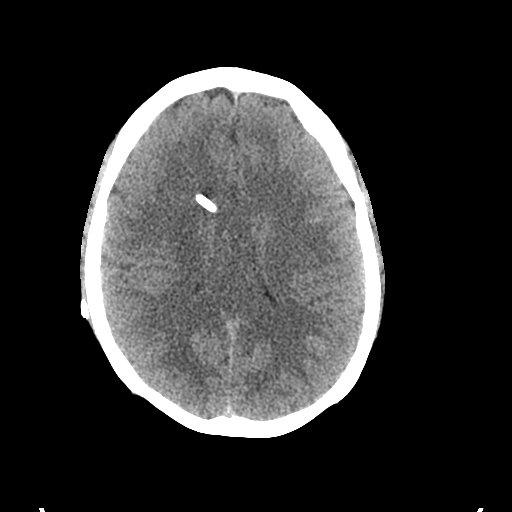
[im 17/32  bone]
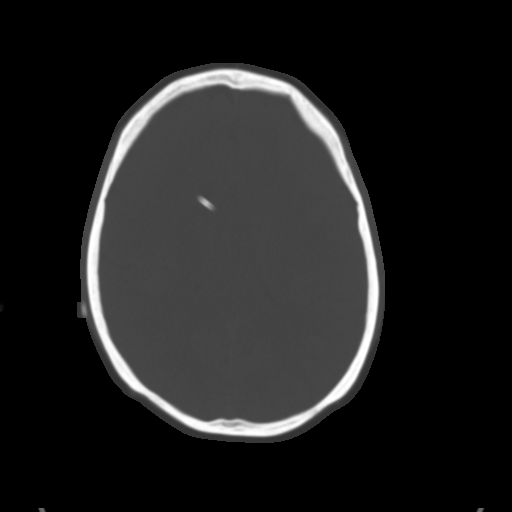
[im 20/32  brain]
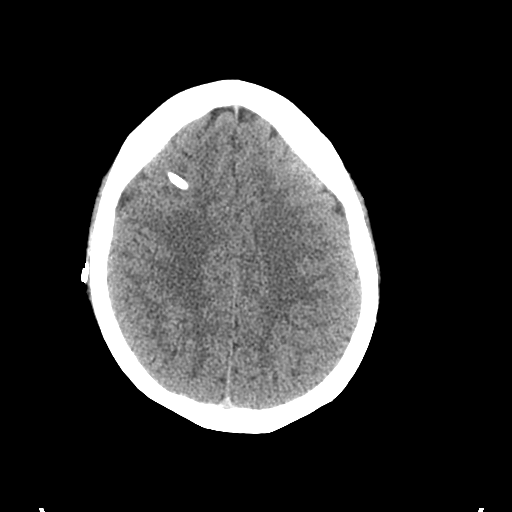
[im 23/32  brain]
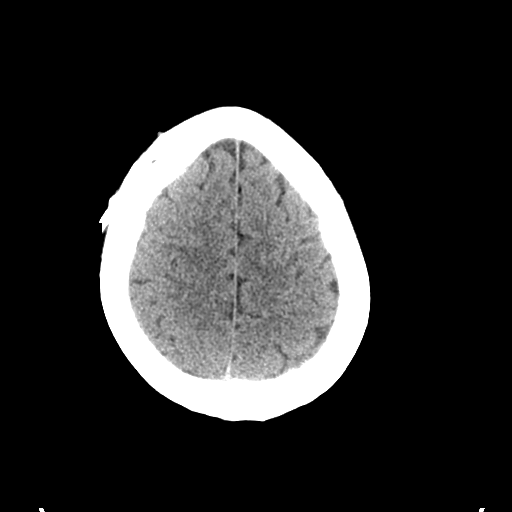
[im 26/32  brain]
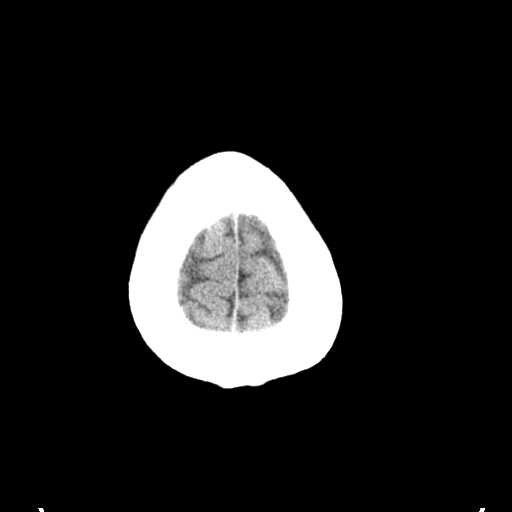
[im 29/32  brain]
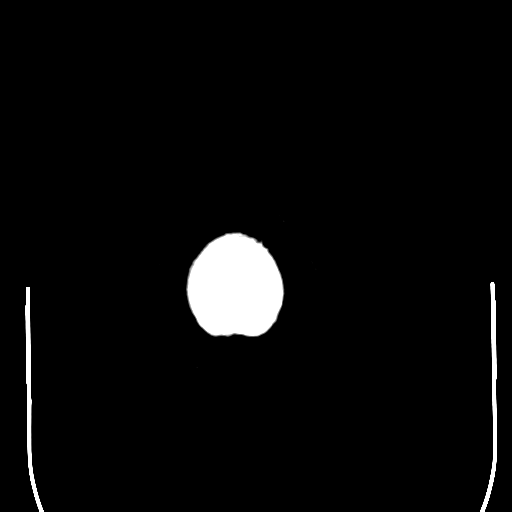
[im 29/32  bone]
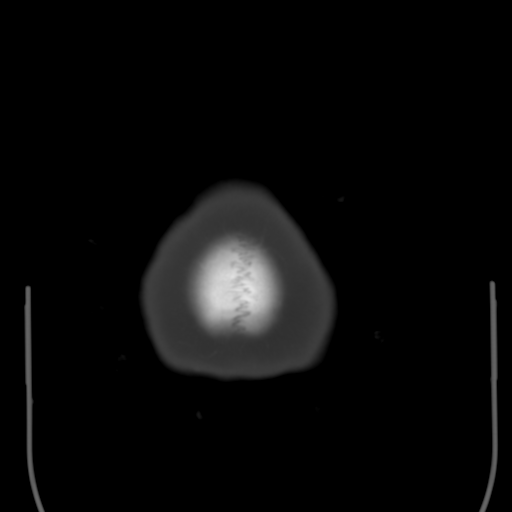

[Series 4: coronal soft tissue · coronal · 0.35mm/px · 3 of 69 slices shown]
[im 23/69  brain]
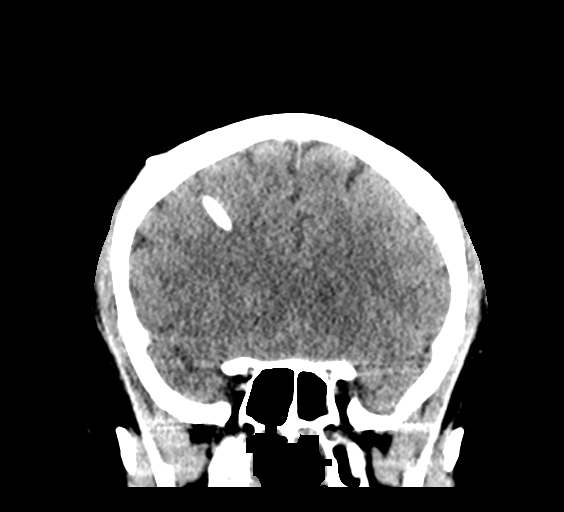
[im 31/69  brain]
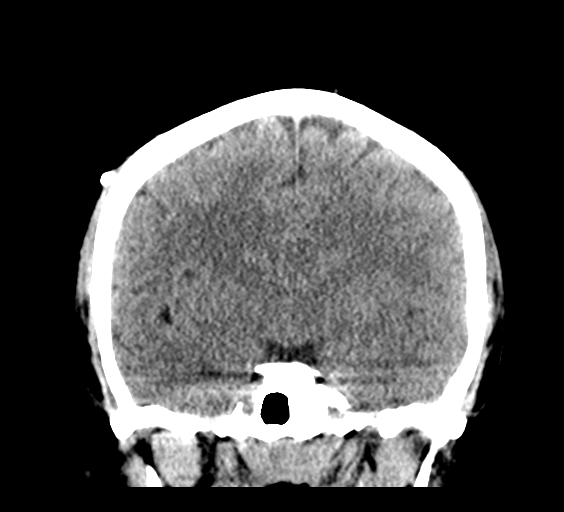
[im 38/69  brain]
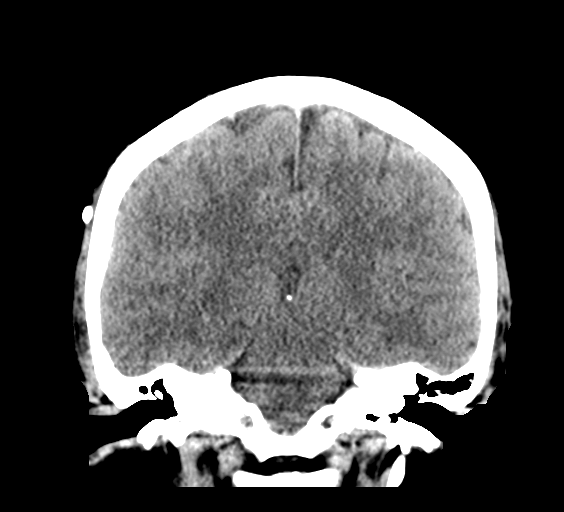

[Series 5: sagittal soft tissue · sagittal · 0.35mm/px · 3 of 66 slices shown]
[im 22/66  brain]
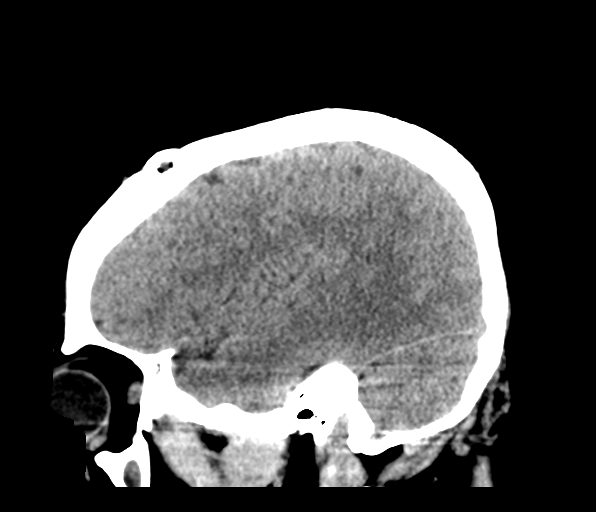
[im 33/66  brain]
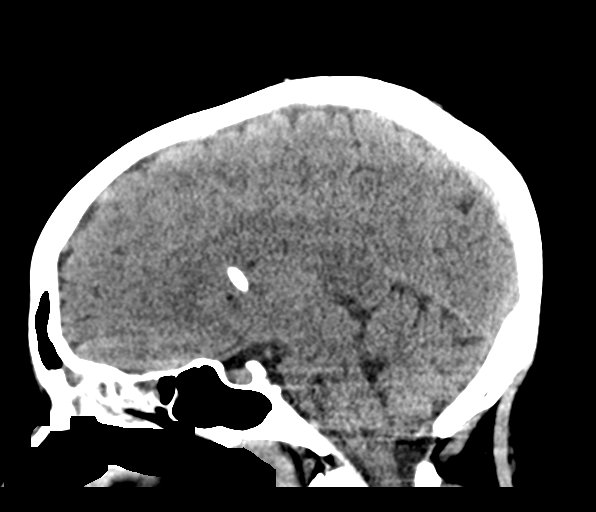
[im 44/66  brain]
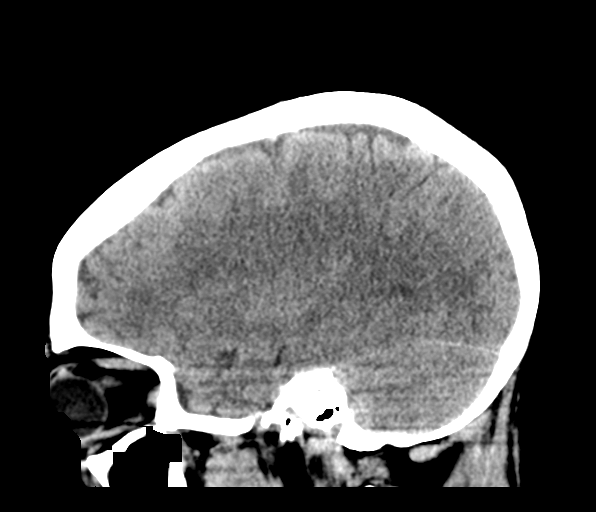

[15 of 47 positions shown; findings below may reference images not displayed]

FINDINGS: Brain: No evidence of acute infarction, hemorrhage, hydrocephalus,
extra-axial collection or mass lesion/mass effect.

A ventriculostomy catheter is seen entering the a right frontal burr
hole. Its distal tip is seen along the midline, in between the
anterior horns of the lateral ventricles.

Vascular: No hyperdense vessel or unexpected calcification.

Skull: Normal. Negative for fracture or focal lesion.

Sinuses/Orbits: There is marked severity bilateral ethmoid sinus and
sphenoid sinus mucosal thickening. A large right maxillary sinus
air-fluid level is also seen.

Other: None.
IMPRESSION: 1. No acute intracranial abnormality.
2. Marked severity bilateral ethmoid sinus and sphenoid sinus and
right maxillary sinus disease.

## 2023-04-25 DIAGNOSIS — Z111 Encounter for screening for respiratory tuberculosis: Secondary | ICD-10-CM | POA: Diagnosis not present

## 2023-04-27 DIAGNOSIS — Z111 Encounter for screening for respiratory tuberculosis: Secondary | ICD-10-CM | POA: Diagnosis not present

## 2023-05-10 DIAGNOSIS — N760 Acute vaginitis: Secondary | ICD-10-CM | POA: Diagnosis not present

## 2023-05-10 DIAGNOSIS — Z124 Encounter for screening for malignant neoplasm of cervix: Secondary | ICD-10-CM | POA: Diagnosis not present

## 2023-05-10 DIAGNOSIS — Z01419 Encounter for gynecological examination (general) (routine) without abnormal findings: Secondary | ICD-10-CM | POA: Diagnosis not present

## 2023-05-10 DIAGNOSIS — N898 Other specified noninflammatory disorders of vagina: Secondary | ICD-10-CM | POA: Diagnosis not present

## 2023-05-10 DIAGNOSIS — Z0000 Encounter for general adult medical examination without abnormal findings: Secondary | ICD-10-CM | POA: Diagnosis not present

## 2023-05-10 DIAGNOSIS — I10 Essential (primary) hypertension: Secondary | ICD-10-CM | POA: Diagnosis not present

## 2023-05-10 DIAGNOSIS — Z113 Encounter for screening for infections with a predominantly sexual mode of transmission: Secondary | ICD-10-CM | POA: Diagnosis not present

## 2023-05-15 DIAGNOSIS — Z713 Dietary counseling and surveillance: Secondary | ICD-10-CM | POA: Diagnosis not present

## 2023-06-10 DIAGNOSIS — Z3202 Encounter for pregnancy test, result negative: Secondary | ICD-10-CM | POA: Diagnosis not present

## 2023-06-10 DIAGNOSIS — R87610 Atypical squamous cells of undetermined significance on cytologic smear of cervix (ASC-US): Secondary | ICD-10-CM | POA: Diagnosis not present

## 2023-06-10 DIAGNOSIS — R87810 Cervical high risk human papillomavirus (HPV) DNA test positive: Secondary | ICD-10-CM | POA: Diagnosis not present

## 2023-06-26 DIAGNOSIS — N898 Other specified noninflammatory disorders of vagina: Secondary | ICD-10-CM | POA: Diagnosis not present

## 2023-06-26 DIAGNOSIS — D069 Carcinoma in situ of cervix, unspecified: Secondary | ICD-10-CM | POA: Diagnosis not present

## 2023-07-22 DIAGNOSIS — F4310 Post-traumatic stress disorder, unspecified: Secondary | ICD-10-CM | POA: Diagnosis not present

## 2023-07-22 DIAGNOSIS — F4323 Adjustment disorder with mixed anxiety and depressed mood: Secondary | ICD-10-CM | POA: Diagnosis not present

## 2023-07-22 DIAGNOSIS — F411 Generalized anxiety disorder: Secondary | ICD-10-CM | POA: Diagnosis not present

## 2023-07-29 DIAGNOSIS — F4323 Adjustment disorder with mixed anxiety and depressed mood: Secondary | ICD-10-CM | POA: Diagnosis not present

## 2023-07-29 DIAGNOSIS — F4310 Post-traumatic stress disorder, unspecified: Secondary | ICD-10-CM | POA: Diagnosis not present

## 2023-07-29 DIAGNOSIS — F411 Generalized anxiety disorder: Secondary | ICD-10-CM | POA: Diagnosis not present

## 2023-08-05 DIAGNOSIS — F4323 Adjustment disorder with mixed anxiety and depressed mood: Secondary | ICD-10-CM | POA: Diagnosis not present

## 2023-08-05 DIAGNOSIS — F4310 Post-traumatic stress disorder, unspecified: Secondary | ICD-10-CM | POA: Diagnosis not present

## 2023-08-05 DIAGNOSIS — F411 Generalized anxiety disorder: Secondary | ICD-10-CM | POA: Diagnosis not present

## 2023-08-12 DIAGNOSIS — F4310 Post-traumatic stress disorder, unspecified: Secondary | ICD-10-CM | POA: Diagnosis not present

## 2023-08-12 DIAGNOSIS — F4323 Adjustment disorder with mixed anxiety and depressed mood: Secondary | ICD-10-CM | POA: Diagnosis not present

## 2023-08-12 DIAGNOSIS — F411 Generalized anxiety disorder: Secondary | ICD-10-CM | POA: Diagnosis not present

## 2023-08-19 DIAGNOSIS — F411 Generalized anxiety disorder: Secondary | ICD-10-CM | POA: Diagnosis not present

## 2023-08-19 DIAGNOSIS — F4323 Adjustment disorder with mixed anxiety and depressed mood: Secondary | ICD-10-CM | POA: Diagnosis not present

## 2023-08-19 DIAGNOSIS — F4310 Post-traumatic stress disorder, unspecified: Secondary | ICD-10-CM | POA: Diagnosis not present

## 2023-08-26 DIAGNOSIS — F4310 Post-traumatic stress disorder, unspecified: Secondary | ICD-10-CM | POA: Diagnosis not present

## 2023-08-26 DIAGNOSIS — F4323 Adjustment disorder with mixed anxiety and depressed mood: Secondary | ICD-10-CM | POA: Diagnosis not present

## 2023-08-26 DIAGNOSIS — F411 Generalized anxiety disorder: Secondary | ICD-10-CM | POA: Diagnosis not present

## 2023-09-02 DIAGNOSIS — F4310 Post-traumatic stress disorder, unspecified: Secondary | ICD-10-CM | POA: Diagnosis not present

## 2023-09-02 DIAGNOSIS — F4323 Adjustment disorder with mixed anxiety and depressed mood: Secondary | ICD-10-CM | POA: Diagnosis not present

## 2023-09-02 DIAGNOSIS — F411 Generalized anxiety disorder: Secondary | ICD-10-CM | POA: Diagnosis not present

## 2023-09-04 DIAGNOSIS — N926 Irregular menstruation, unspecified: Secondary | ICD-10-CM | POA: Diagnosis not present

## 2023-09-04 DIAGNOSIS — R7301 Impaired fasting glucose: Secondary | ICD-10-CM | POA: Diagnosis not present

## 2023-09-04 DIAGNOSIS — Z1159 Encounter for screening for other viral diseases: Secondary | ICD-10-CM | POA: Diagnosis not present

## 2023-09-04 DIAGNOSIS — I10 Essential (primary) hypertension: Secondary | ICD-10-CM | POA: Diagnosis not present

## 2023-09-04 DIAGNOSIS — E282 Polycystic ovarian syndrome: Secondary | ICD-10-CM | POA: Diagnosis not present

## 2023-09-04 DIAGNOSIS — E669 Obesity, unspecified: Secondary | ICD-10-CM | POA: Diagnosis not present

## 2023-09-04 DIAGNOSIS — Z6841 Body Mass Index (BMI) 40.0 and over, adult: Secondary | ICD-10-CM | POA: Diagnosis not present

## 2023-09-04 DIAGNOSIS — E66813 Obesity, class 3: Secondary | ICD-10-CM | POA: Diagnosis not present

## 2023-09-05 ENCOUNTER — Ambulatory Visit (INDEPENDENT_AMBULATORY_CARE_PROVIDER_SITE_OTHER): Admitting: Bariatrics

## 2023-09-05 ENCOUNTER — Encounter: Payer: Self-pay | Admitting: Bariatrics

## 2023-09-05 VITALS — BP 136/88 | HR 88 | Temp 98.1°F | Ht 67.0 in | Wt 293.0 lb

## 2023-09-05 DIAGNOSIS — Z6841 Body Mass Index (BMI) 40.0 and over, adult: Secondary | ICD-10-CM | POA: Diagnosis not present

## 2023-09-05 DIAGNOSIS — G932 Benign intracranial hypertension: Secondary | ICD-10-CM

## 2023-09-05 DIAGNOSIS — E66813 Obesity, class 3: Secondary | ICD-10-CM

## 2023-09-05 DIAGNOSIS — E65 Localized adiposity: Secondary | ICD-10-CM

## 2023-09-05 NOTE — Progress Notes (Signed)
 Office: [phone]  /  Fax: [phone]   Initial Visit  Megan Wade was seen in clinic today to evaluate for obesity. She is interested in losing weight to improve overall health and reduce the risk of weight related complications. She presents today to review program treatment options, initial physical assessment, and evaluation.     She was referred by: PCP  When asked what else they would like to accomplish? She states: Adopt a healthier eating pattern and lifestyle, Improve energy levels and physical activity, Improve existing medical conditions, and Improve quality of life  When asked how has your weight affected you? She states: Contributed to medical problems and Contributed to orthopedic problems or mobility issues  Some associated conditions:  idiopathic intracranial hypertension, hypertension  Contributing factors: family history of obesity, moderate to high levels of stress, slow metabolism for age, and sedentary job  Weight promoting medications identified: None  Current nutrition plan: Portion control / smart choices and Other: Intermittent fasting, herbal life, and vegan  Current level of physical activity: walking and workout boot camp.   Current or previous pharmacotherapy: Phentermine, currently but has not taken for about 1 month.  Response to medication: some weight loss.    Past medical history includes:   Past Medical History:  Diagnosis Date   Headache    Hypertension    Pseudotumor cerebri    Dr Toribio Dub at [facility] allergies      Objective:   BP 136/88   Pulse 88   Temp 98.1 F (36.7 C)   Ht 5' 7 (1.702 m)   Wt 293 lb (132.9 kg)   SpO2 100%   BMI 45.89 kg/m  She was weighed on the bioimpedance scale: Body mass index is 45.89 kg/m.  Peak Weight:300 lbs , Body Fat%:49.9%, Visceral Fat Rating:14, Weight trend over the last 12 months: fluctuating.   General:  Alert, oriented and cooperative. Patient is in no acute  distress.  Respiratory: Normal respiratory effort, no problems with respiration noted  Extremities: Normal range of motion.    Mental Status: Normal mood and affect. Normal behavior. Normal judgment and thought content.   DIAGNOSTIC DATA REVIEWED:  BMET    Component Value Date/Time   NA 135 06/24/2021 1712   NA CANCELED 06/11/2012 1618   K 3.8 06/24/2021 1712   CL 103 06/24/2021 1712   CO2 25 06/24/2021 1712   GLUCOSE 101 (H) 06/24/2021 1712   BUN 8 06/24/2021 1712   BUN 8 06/11/2012 1618   CREATININE 0.76 06/24/2021 1712   CALCIUM 9.4 06/24/2021 1712   GFRNONAA >60 06/24/2021 1712   GFRAA CANCELED 06/11/2012 1618   No results found for: HGBA1C No results found for: INSULIN CBC    Component Value Date/Time   WBC 6.5 06/24/2021 1712   RBC 4.74 06/24/2021 1712   HGB 14.1 06/24/2021 1712   HCT 41.8 06/24/2021 1712   PLT 285 06/24/2021 1712   MCV 88.2 06/24/2021 1712   MCV 91.7 06/11/2012 1620   MCH 29.7 06/24/2021 1712   MCHC 33.7 06/24/2021 1712   RDW 14.5 06/24/2021 1712   Iron/TIBC/Ferritin/ %Sat No results found for: IRON, TIBC, FERRITIN, IRONPCTSAT Lipid Panel  No results found for: CHOL, TRIG, HDL, CHOLHDL, VLDL, LDLCALC, LDLDIRECT Hepatic Function Panel     Component Value Date/Time   PROT 7.1 05/05/2014 1223   PROT 8.1 06/11/2012 1618   ALBUMIN 4.2 05/05/2014 1223   ALBUMIN 4.5 06/11/2012 1618   AST 15 05/05/2014 1223  ALT 16 05/05/2014 1223   ALKPHOS 58 05/05/2014 1223   BILITOT 0.4 05/05/2014 1223   BILIDIR 0.1 05/05/2014 1223   IBILI 0.3 05/05/2014 1223      Component Value Date/Time   TSH 3.837 06/24/2021 1800   TSH 1.736 11/02/2011 1355     Assessment and Plan:   Idiopathic intracranial hypertension:  She has a history of pseudotumor cerebri but has not had symptoms in a number of years.  She was diagnosed with the condition about 15 years ago was evaluated at that time.  She initially had ophthalmic symptoms but  these have essentially resolved.  She is not currently seeing a neurologist but is being managed by her PCP.    Plan: She will begin the program.  Discussed that weight loss is helpful for idiopathic intracranial hypertension. She will follow-up with her PCP.  Visceral Obesity.   She has a visceral fat rating of 14 per the bio-impedence scale.   Plan: The goal is a visceral fat rating of 13 or below.  Will begin the  plan and and exercise.  Will minimize all carbohydrates ( sweets and starches ).     Morbid Obesity: Current BMI 45.89    Obesity Treatment / Action Plan:  Patient will work on garnering support from family and friends to begin weight loss journey. Will work on eliminating or reducing the presence of highly palatable, calorie dense foods in the home. Will complete provided nutritional and psychosocial assessment questionnaire before the next appointment. Will be scheduled for indirect calorimetry to determine resting energy expenditure in a fasting state.  This will allow us  to create a reduced calorie, high-protein meal plan to promote loss of fat mass while preserving muscle mass. Counseled on the health benefits of losing 5%-15% of total body weight. Was counseled on nutritional approaches to weight loss and benefits of reducing processed foods and consuming plant-based foods and high quality protein as part of nutritional weight management. Was counseled on pharmacotherapy and role as an adjunct in weight management.   Obesity Education Performed Today:  She was weighed on the bioimpedance scale and results were discussed and documented in the synopsis.  We discussed obesity as a disease and the importance of a more detailed evaluation of all the factors contributing to the disease.  We discussed the importance of long term lifestyle changes which include nutrition, exercise and behavioral modifications as well as the importance of customizing this to her specific  health and social needs.  We discussed the benefits of reaching a healthier weight to alleviate the symptoms of existing conditions and reduce the risks of the biomechanical, metabolic and psychological effects of obesity.  Discussed New Patient/Late Arrival, and Cancellation Policies. Patient voiced understanding and allowed to ask questions.   Megan Wade appears to be in the action stage of change and states they are ready to start intensive lifestyle modifications and behavioral modifications.  30 minutes was spent today on this visit including the above counseling, pre-visit chart review, and post-visit documentation.  Reviewed by clinician on day of visit: allergies, medications, problem list, medical history, surgical history, family history, social history, and previous encounter notes.    Carold Eisner A. Delores CORDOBAO.

## 2023-09-09 DIAGNOSIS — Z0289 Encounter for other administrative examinations: Secondary | ICD-10-CM

## 2023-09-09 DIAGNOSIS — F411 Generalized anxiety disorder: Secondary | ICD-10-CM | POA: Diagnosis not present

## 2023-09-16 DIAGNOSIS — F411 Generalized anxiety disorder: Secondary | ICD-10-CM | POA: Diagnosis not present

## 2023-09-20 ENCOUNTER — Ambulatory Visit
Admission: RE | Admit: 2023-09-20 | Discharge: 2023-09-20 | Disposition: A | Discharge status: Home / Self Care | Payer: Self-pay | Source: Ambulatory Visit | Attending: Family Medicine | Admitting: Family Medicine

## 2023-09-20 ENCOUNTER — Ambulatory Visit: Payer: Self-pay | Admitting: Urgent Care

## 2023-09-20 ENCOUNTER — Ambulatory Visit (INDEPENDENT_AMBULATORY_CARE_PROVIDER_SITE_OTHER)

## 2023-09-20 VITALS — BP 135/103 | HR 96 | Temp 98.2°F | Resp 18

## 2023-09-20 DIAGNOSIS — M25559 Pain in unspecified hip: Secondary | ICD-10-CM

## 2023-09-20 DIAGNOSIS — M545 Low back pain, unspecified: Secondary | ICD-10-CM

## 2023-09-20 DIAGNOSIS — R102 Pelvic and perineal pain: Secondary | ICD-10-CM | POA: Diagnosis not present

## 2023-09-20 MED ORDER — CYCLOBENZAPRINE HCL 5 MG PO TABS: 5.0000 mg | ORAL_TABLET | Freq: Every evening | ORAL | 0 refills | Status: DC | PRN

## 2023-09-20 MED ORDER — MELOXICAM 15 MG PO TABS: 15.0000 mg | ORAL_TABLET | Freq: Every day | ORAL | 0 refills | Status: AC

## 2023-09-20 MED ORDER — CYCLOBENZAPRINE HCL 5 MG PO TABS: 5.0000 mg | ORAL_TABLET | Freq: Every evening | ORAL | 0 refills | Status: AC | PRN

## 2023-09-20 NOTE — ED Provider Notes (Signed)
 [name]  Note:  This document was prepared using Conservation officer, historic buildings and may include unintentional dictation errors.  MRN: [id number] DOB: 1995/06/13  Subjective:   Megan Wade is a 28 y.o. female presenting for 5-day history of severe low back pain, posterior hip pain.  Feels that it is radiating toward the left side into the buttock.  Feels like a pinched nerve.  No fall, trauma, weakness, numbness or tingling, changes to bowel or urinary habits.  However, patient did endorse that she had significant difficulty walking as a result of her pain.  Today was the first time she was able to walk without using a walker.  Has been taking ibuprofen without relief.  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  No current facility-administered medications for this encounter.  Current Outpatient Medications:    ibuprofen (ADVIL) 200 MG tablet, Take 200 mg by mouth every 6 (six) hours as needed., Disp: , Rfl:    acetaminophen  (TYLENOL ) 500 MG tablet, Take 2 tablets (1,000 mg total) by mouth every 6 (six) hours as needed for fever or headache., Disp: 30 tablet, Rfl: 0   amLODipine  (NORVASC ) 5 MG tablet, Take 1 tablet (5 mg total) by mouth daily. (Patient not taking: No sig reported), Disp: 30 tablet, Rfl: 0   cetirizine  (ZYRTEC  ALLERGY) 10 MG tablet, Take 1 tablet (10 mg total) by mouth daily., Disp: 30 tablet, Rfl: 0   fluticasone  (FLONASE ) 50 MCG/ACT nasal spray, Place 2 sprays into both nostrils daily., Disp: 18.2 mL, Rfl: 0   losartan (COZAAR) 50 MG tablet, Take 1 tablet every day by oral route as directed for 90 days., Disp: , Rfl:    No Known Allergies  Past Medical History:  Diagnosis Date   Headache    Hypertension    Pseudotumor cerebri    Dr Toribio Dub at [facility]   Seasonal allergies      Past Surgical History:  Procedure Laterality Date   BREAST REDUCTION WITH MASTOPEXY Bilateral 07/04/2021   Procedure: BILATERAL  BREAST REDUCTION WITH MAMMOPLASTY;  Surgeon: Creola Ronal Caldron, MD;  Location: [facility];  Service: Plastics;  Laterality: Bilateral;   LAPAROSCOPIC REVISION VENTRICULAR-PERITONEAL (V-P) SHUNT      Family History  Problem Relation Age of Onset   Hypertension Mother    Hypertension Father    Cancer Maternal Grandfather    Diabetes Paternal Grandfather     Social History   Tobacco Use   Smoking status: Former    Types: Cigars   Smokeless tobacco: Never  Vaping Use   Vaping status: Every Day  Substance Use Topics   Alcohol use: Yes    Comment: Social    Drug use: Never    ROS   Objective:   Vitals: BP (!) 135/103 (BP Location: Right Arm)   Pulse 96   Temp 98.2 F (36.8 C) (Oral)   Resp 18   LMP  (Within Weeks) Comment: 1 week  SpO2 97%   BP Readings from Last 3 Encounters:  09/20/23 (!) 135/103  09/05/23 136/88  07/04/21 130/79   Physical Exam Constitutional:      General: She is not in acute distress.    Appearance: Normal appearance. She is well-developed. She is not ill-appearing, toxic-appearing or diaphoretic.  HENT:     Head: Normocephalic and atraumatic.     Nose: Nose normal.     Mouth/Throat:     Mouth: Mucous membranes are moist.  Eyes:     General: No scleral icterus.       Right eye: No discharge.        Left eye: No discharge.     Extraocular Movements: Extraocular movements intact.  Cardiovascular:     Rate and Rhythm: Normal rate.  Pulmonary:     Effort: Pulmonary effort is normal.  Musculoskeletal:     Lumbar back: Spasms and tenderness (across areas outlined) present. No swelling, edema, deformity, signs of trauma, lacerations or bony tenderness. Decreased range of motion. Positive left straight leg raise test. Negative right straight leg raise test. No scoliosis.       Back:  Skin:    General: Skin is warm and dry.  Neurological:     General: No focal deficit present.     Mental Status: She is alert and  oriented to person, place, and time.  Psychiatric:        Mood and Affect: Mood normal.        Behavior: Behavior normal.    DG Pelvis 1-2 Views Result Date: 09/20/2023 CLINICAL DATA:  Pelvic pain. EXAM: PELVIS - 1-2 VIEW COMPARISON:  None Available. FINDINGS: There is no evidence of pelvic fracture or diastasis. No pelvic bone lesions are seen. IMPRESSION: Negative. Electronically Signed   By: Lynwood Landy Raddle M.D.   On: 09/20/2023 14:23   DG Lumbar Spine Complete Result Date: 09/20/2023 CLINICAL DATA:  Low back pain. EXAM: LUMBAR SPINE - COMPLETE 4+ VIEW COMPARISON:  None Available. FINDINGS: There is no evidence of lumbar spine fracture. Alignment is normal. Intervertebral disc spaces are maintained. IMPRESSION: Negative. Electronically Signed   By: Lynwood Landy Raddle M.D.   On: 09/20/2023 14:22     Assessment and Plan :   PDMP not reviewed this encounter.  1. Acute bilateral low back pain without sciatica   2. Posterior pain of hip, unspecified laterality    Given her history of pseudotumor cerebri, counseled against prednisone.  Recommended meloxicam high-dose.  Using muscle relaxant as well.  Discussed back care.  Follow-up with an orthopedist group as soon as possible.  Counseled patient on potential for adverse effects with medications prescribed/recommended today, ER and return-to-clinic precautions discussed, patient verbalized understanding.    Megan Wade, [place] 09/20/23 1433

## 2023-09-20 NOTE — ED Triage Notes (Signed)
 Pt reports low back pain x 5 days. States started in the tailbone. States for the past couple days she was using a walker, today is the 1st day walking without help. Ibuprofen gives no relief,

## 2023-09-23 DIAGNOSIS — F411 Generalized anxiety disorder: Secondary | ICD-10-CM | POA: Diagnosis not present

## 2023-09-30 DIAGNOSIS — Z6841 Body Mass Index (BMI) 40.0 and over, adult: Secondary | ICD-10-CM | POA: Diagnosis not present

## 2023-09-30 DIAGNOSIS — E282 Polycystic ovarian syndrome: Secondary | ICD-10-CM | POA: Diagnosis not present

## 2023-09-30 DIAGNOSIS — Z0000 Encounter for general adult medical examination without abnormal findings: Secondary | ICD-10-CM | POA: Diagnosis not present

## 2023-09-30 DIAGNOSIS — I10 Essential (primary) hypertension: Secondary | ICD-10-CM | POA: Diagnosis not present

## 2023-10-01 ENCOUNTER — Ambulatory Visit: Admitting: Bariatrics

## 2023-10-01 ENCOUNTER — Encounter: Payer: Self-pay | Admitting: Bariatrics

## 2023-10-01 VITALS — BP 137/95 | HR 95 | Temp 98.0°F | Ht 67.0 in | Wt 291.0 lb

## 2023-10-01 DIAGNOSIS — R0602 Shortness of breath: Secondary | ICD-10-CM | POA: Diagnosis not present

## 2023-10-01 DIAGNOSIS — Z6841 Body Mass Index (BMI) 40.0 and over, adult: Secondary | ICD-10-CM | POA: Diagnosis not present

## 2023-10-01 DIAGNOSIS — R5383 Other fatigue: Secondary | ICD-10-CM | POA: Diagnosis not present

## 2023-10-01 DIAGNOSIS — Z1331 Encounter for screening for depression: Secondary | ICD-10-CM | POA: Diagnosis not present

## 2023-10-01 DIAGNOSIS — E66813 Obesity, class 3: Secondary | ICD-10-CM

## 2023-10-01 DIAGNOSIS — F411 Generalized anxiety disorder: Secondary | ICD-10-CM | POA: Diagnosis not present

## 2023-10-01 DIAGNOSIS — E88819 Insulin resistance, unspecified: Secondary | ICD-10-CM

## 2023-10-01 DIAGNOSIS — Z0000 Encounter for general adult medical examination without abnormal findings: Secondary | ICD-10-CM | POA: Diagnosis not present

## 2023-10-01 DIAGNOSIS — E65 Localized adiposity: Secondary | ICD-10-CM

## 2023-10-01 DIAGNOSIS — E559 Vitamin D deficiency, unspecified: Secondary | ICD-10-CM

## 2023-10-01 MED ORDER — METFORMIN HCL 500 MG PO TABS: 500.0000 mg | ORAL_TABLET | Freq: Every day | ORAL | 0 refills | Status: DC

## 2023-10-01 NOTE — Progress Notes (Signed)
 At a Glance:  Vitals Temp: 98 F (36.7 C) BP: (!) 137/95 Pulse Rate: 95 SpO2: 99 %   Anthropometric Measurements Height: 5' 7 (1.702 m) Weight: 291 lb (132 kg) BMI (Calculated): 45.57 Starting Weight: 291lb Peak Weight: 300lb   Body Composition  Body Fat %: 51.3 % Fat Mass (lbs): 149.4 lbs Muscle Mass (lbs): 134.6 lbs Total Body Water (lbs): 105.6 lbs Visceral Fat Rating : 14   Other Clinical Data RMR: 2304 Fasting: yes Labs: yes Today's Visit #: 1 Starting Date: 10/01/23    EKG: Normal sinus rhythm, rate 91.  Indirect Calorimeter:   Resting Metabolic Rate ( RMR):  RMR (actual): 2304 kcal RMR (calculated): 2088 kcal The calculated basal metabolic rate is 7911 kcal thus her basal metabolic rate is better than expected.  Plan:   Indirect calorimeter completed, interpreted and reviewed with patient today and allowed to ask questions.  Discussed the implications for the chosen plan and exercise based on the RMR reading.  Will consider repeating the RMR in the future based on weight loss.    Chief Complaint:  Obesity   Subjective:  Megan Wade (MR# [id number]) is a 28 y.o. female who presents for evaluation and treatment of obesity and related comorbidities.   Megan Wade is currently in the action stage of change and ready to dedicate time achieving and maintaining a healthier weight. Megan Wade is interested in becoming our patient and working on intensive lifestyle modifications including (but not limited to) diet and exercise for weight loss.  Megan Wade has been struggling with her weight. She has been unsuccessful in either losing weight, maintaining weight loss, or reaching her healthy weight goal.  Megan Wade's habits were reviewed today and are as follows: Her family eats meals together, she thinks her family will eat healthier with her, she started gaining weight in middle school and high school, and she has significant food cravings issues.  Current or previous  pharmacotherapy: Phentermine  Response to medication: Had side effects so it was discontinued  Other Fatigue Megan Wade denies daytime somnolence and admits to waking up still tired. Patient has a history of symptoms of daytime fatigue. Megan Wade generally gets 7 hours of sleep per night, and states that she has difficulty falling asleep. Snoring is present. Apneic episodes are not present. Epworth Sleepiness Score is 11.   Shortness of Breath Megan Wade notes increasing shortness of breath with exercising and seems to be worsening over time with weight gain. She notes getting out of breath sooner with activity than she used to. This has gotten worse recently. Megan Wade denies shortness of breath at rest or orthopnea.  Depression Screen Megan Wade's Food and Mood (modified PHQ-9) score was 10. 10-14 moderate depression      No data to display           Assessment and Plan:   Other Fatigue Megan Wade does feel that her weight is causing her energy to be lower than it should be. Fatigue may be related to obesity, depression or many other causes. Labs will be ordered, and in the meanwhile, Megan Wade will focus on self care including making healthy food choices, increasing physical activity and focusing on stress reduction.  Shortness of Breath Megan Wade does feel that she gets out of breath more easily that she used to when she exercises. Megan Wade's shortness of breath appears to be obesity related and exercise induced. She has agreed to work on weight loss and gradually increase exercise to treat her exercise induced shortness of breath. Will continue to  monitor closely.  Health Maintenance:   Obesity   Plan: Will do EKG, indirect calorimetry, and labs.     Vitamin D  Deficiency She is at risk for vitamin D  deficiency due to obesity.  She is not on any supplements.  No results found for: VD25OH  Plan: Will check for vitamin D  deficiency.   Megan Wade had a positive depression screening. Depression is commonly  associated with obesity and often results in emotional eating behaviors. We will monitor this closely and work on CBT to help improve the non-hunger eating patterns. Referral to Psychology may be required if no improvement is seen as she continues in our clinic.   Insulin Resistance Megan Wade has had elevated fasting insulin readings. Goal is HgbA1c < 5.7, fasting insulin at l0 or less, and preferably at 5.  She  denies polyphagia. Medication(s): none   Plan Medication(s): New start: Metformin  500 mg 1 in the am # 30 X 0.  Will work on the agreed upon plan. Will minimize refined carbohydrates ( sweets and starches), and focus more on complex carbohydrates.  Increase the micronutrients found in leafy greens, which include magnesium, polyphenols, and vitamin C which have been postulated to help with insulin sensitivity. Minimize fast food and cook more meals at home.  Increase fiber to 25 to 30 grams daily.   Visceral Obesity.   She has a visceral fat rating of 14 per the bio-impedence scale.   Plan: The goal is a visceral fat rating of 13 or below.  She will work on eating healthy fats, minimizing some saturated fats, and will eliminate any trans fats from her diet. Will begin the plan and exercise Will minimize all carbohydrates ( sweets and starches ).    Previous labs reviewed today: 09/04/2023 ( Lipid panel, testosterone, free and total and insulin, Hemoglobin A1c, DHEA-sulfate,   Labs done today Vit D, Thyroid  Panel, and CBC   Morbid Obesity: BMI (Calculated): 45.57   Megan Wade is currently in the action stage of change and her goal is to begin weight loss efforts. I recommend Megan Wade begin the structured treatment plan as follows:  She has agreed to Category 4 Plan and keeping a food journal and adhering to recommended goals of 1,800 calories and 100 to 140 grams of  protein  Exercise goals: For substantial health benefits, adults should do at least 150 minutes (2 hours and 30  minutes) a week of moderate-intensity, or 75 minutes (1 hour and 15 minutes) a week of vigorous-intensity aerobic physical activity, or an equivalent combination of moderate- and vigorous-intensity aerobic activity. Aerobic activity should be performed in episodes of at least 10 minutes, and preferably, it should be spread throughout the week.  Behavioral modification strategies:increasing lean protein intake, decreasing simple carbohydrates, increasing vegetables, increase H2O intake, no skipping meals, keeping healthy foods in the home, avoiding temptations, and planning for success  She was informed of the importance of frequent follow-up visits to maximize her success with intensive lifestyle modifications for her multiple health conditions. She was informed we would discuss her lab results at her next visit unless there is a critical issue that needs to be addressed sooner. Megan Wade agreed to keep her next visit at the agreed upon time to discuss these results.  Objective:  General: Cooperative, alert, well developed, in no acute distress. HEENT: Conjunctivae and lids unremarkable. Cardiovascular: Regular rhythm.  Lungs: Normal work of breathing. Neurologic: No focal deficits.   Lab Results  Component Value Date   CREATININE 0.76 06/24/2021  BUN 8 06/24/2021   NA 135 06/24/2021   K 3.8 06/24/2021   CL 103 06/24/2021   CO2 25 06/24/2021   Lab Results  Component Value Date   ALT 16 05/05/2014   AST 15 05/05/2014   ALKPHOS 58 05/05/2014   BILITOT 0.4 05/05/2014   No results found for: HGBA1C No results found for: INSULIN Lab Results  Component Value Date   TSH 3.837 06/24/2021   No results found for: CHOL, HDL, LDLCALC, LDLDIRECT, TRIG, CHOLHDL Lab Results  Component Value Date   WBC 6.5 06/24/2021   HGB 14.1 06/24/2021   HCT 41.8 06/24/2021   MCV 88.2 06/24/2021   PLT 285 06/24/2021   No results found for: IRON, TIBC, FERRITIN  Attestation  Statements:  Applicable history such as the following:  allergies, medications, problem list, medical history, surgical history, family history, social history, and previous encounter notes reviewed by clinician on day of visit:  Time spent on visit in care of the patient today including the items listed below was 45 minutes.    20 minutes were spent talking about the history, 25 minutes for face to face counseling implementing the plan, discussing the specifics of how to arrange meals, meal planning, water intake.   I spent face to face time discussing his/her plan, including breakfast, additional breakfast options, lunch, and dinner options, grocery list, and snacks.  I reviewed her indirect calorimetry. I discussed the implications for the diet plan.    Discussed the bio-impedence test (fat %, muscle mass, and water weight) and allowed the patient to ask questions.   Discussed the following information sheets: Category 4.   I reviewed the labs which were ordered from her visit on 09/04/2023. ,   I additionally spent time documenting, reviewing, and checking the codes before submitting.   This may have been prepared with the assistance of Engineer, civil (consulting).  Occasional wrong-word or sound-a-like substitutions may have occurred due to the inherent limitations of voice recognition software.    Clayborne Daring, DO

## 2023-10-02 LAB — CBC WITH DIFFERENTIAL/PLATELET
Basophils Absolute: 0 x10E3/uL (ref 0.0–0.2)
Basos: 1 %
EOS (ABSOLUTE): 0.1 x10E3/uL (ref 0.0–0.4)
Eos: 1 %
Hematocrit: 39.5 % (ref 34.0–46.6)
Hemoglobin: 12.7 g/dL (ref 11.1–15.9)
Immature Grans (Abs): 0 x10E3/uL (ref 0.0–0.1)
Immature Granulocytes: 0 %
Lymphocytes Absolute: 2.1 x10E3/uL (ref 0.7–3.1)
Lymphs: 44 %
MCH: 29.7 pg (ref 26.6–33.0)
MCHC: 32.2 g/dL (ref 31.5–35.7)
MCV: 92 fL (ref 79–97)
Monocytes Absolute: 0.3 x10E3/uL (ref 0.1–0.9)
Monocytes: 6 %
Neutrophils Absolute: 2.4 x10E3/uL (ref 1.4–7.0)
Neutrophils: 48 %
Platelets: 270 x10E3/uL (ref 150–450)
RBC: 4.28 x10E6/uL (ref 3.77–5.28)
RDW: 14.4 % (ref 11.7–15.4)
WBC: 4.9 x10E3/uL (ref 3.4–10.8)

## 2023-10-02 LAB — TSH+T4F+T3FREE
Free T4: 1.25 ng/dL (ref 0.82–1.77)
T3, Free: 3.7 pg/mL (ref 2.0–4.4)
TSH: 3.39 u[IU]/mL (ref 0.450–4.500)

## 2023-10-02 LAB — VITAMIN D 25 HYDROXY (VIT D DEFICIENCY, FRACTURES): Vit D, 25-Hydroxy: 19 ng/mL — ABNORMAL LOW (ref 30.0–100.0)

## 2023-10-03 ENCOUNTER — Encounter: Payer: Self-pay | Admitting: Bariatrics

## 2023-10-03 DIAGNOSIS — E559 Vitamin D deficiency, unspecified: Secondary | ICD-10-CM | POA: Insufficient documentation

## 2023-10-07 DIAGNOSIS — F411 Generalized anxiety disorder: Secondary | ICD-10-CM | POA: Diagnosis not present

## 2023-10-07 DIAGNOSIS — Z3202 Encounter for pregnancy test, result negative: Secondary | ICD-10-CM | POA: Diagnosis not present

## 2023-10-07 DIAGNOSIS — D069 Carcinoma in situ of cervix, unspecified: Secondary | ICD-10-CM | POA: Diagnosis not present

## 2023-10-14 DIAGNOSIS — F411 Generalized anxiety disorder: Secondary | ICD-10-CM | POA: Diagnosis not present

## 2023-10-15 ENCOUNTER — Ambulatory Visit (INDEPENDENT_AMBULATORY_CARE_PROVIDER_SITE_OTHER): Admitting: Bariatrics

## 2023-10-15 ENCOUNTER — Encounter: Payer: Self-pay | Admitting: Bariatrics

## 2023-10-15 VITALS — BP 135/90 | HR 87 | Temp 98.2°F | Ht 67.0 in | Wt 285.0 lb

## 2023-10-15 DIAGNOSIS — G932 Benign intracranial hypertension: Secondary | ICD-10-CM | POA: Diagnosis not present

## 2023-10-15 DIAGNOSIS — E88819 Insulin resistance, unspecified: Secondary | ICD-10-CM | POA: Diagnosis not present

## 2023-10-15 DIAGNOSIS — Z6841 Body Mass Index (BMI) 40.0 and over, adult: Secondary | ICD-10-CM

## 2023-10-15 DIAGNOSIS — E66813 Obesity, class 3: Secondary | ICD-10-CM

## 2023-10-15 MED ORDER — VITAMIN D (ERGOCALCIFEROL) 1.25 MG (50000 UNIT) PO CAPS: 50000.0000 [IU] | ORAL_CAPSULE | ORAL | 0 refills | Status: DC

## 2023-10-15 NOTE — Progress Notes (Signed)
 First follow-up after initial visit.        WEIGHT SUMMARY AND BIOMETRICS  Weight Lost Since Last Visit: 6lb  Weight Gained Since Last Visit: 0   Vitals Temp: 98.2 F (36.8 C) BP: (!) 135/90 Pulse Rate: 87 SpO2: 99 %   Anthropometric Measurements Height: 5' 7 (1.702 m) Weight: 285 lb (129.3 kg) BMI (Calculated): 44.63 Weight at Last Visit: 291lb Weight Lost Since Last Visit: 6lb Weight Gained Since Last Visit: 0 Starting Weight: 291lb Total Weight Loss (lbs): 6 lb (2.722 kg) Peak Weight: 300lb   Body Composition  Body Fat %: 47.9 % Fat Mass (lbs): 136.8 lbs Muscle Mass (lbs): 141.4 lbs Total Body Water (lbs): 105 lbs Visceral Fat Rating : 13   Other Clinical Data Fasting: no Labs: no Today's Visit #: 2 Starting Date: 10/01/23    OBESITY Megan Wade is here to discuss her progress with her obesity treatment plan along with follow-up of her obesity related diagnoses.    Nutrition Plan: the Category 4 plan and keeping a food journal with goal of 1800 calories and 100-140 grams of protein daily - 90% adherence.  Current exercise: none  Interim History:  She is down 6 lbs since her last visit.  Eating all of the food on the plan., Protein intake is as prescribed, Is skipping meals, Water intake is adequate., and Denies polyphagia  Initial positives regarding the dietary plan:  Initial challenges regarding  the dietary plan:   Pharmacotherapy: Megan Wade is on Metformin  500 mg once daily breakfast Adverse side effects: None Hunger is moderately controlled.  Cravings are moderately controlled.  Assessment/Plan:   Insulin Resistance Megan Wade has had elevated fasting insulin readings per history . Goal is HgbA1c < 5.7, fasting insulin at l0 or less, and preferably at 5.  She  denies polyphagia. Medication(s): Metformin    Plan Medication(s):  Metformin  500 mg once daily breakfast Will work on the agreed upon plan. Will minimize refined carbohydrates ( sweets and starches), and focus more on complex carbohydrates.  Increase the micronutrients found in leafy greens, which include magnesium, polyphenols, and vitamin C which have been postulated to help with insulin sensitivity. Minimize fast food and cook more meals at home.  Increase fiber to 25 to 30 grams daily.  Information sheet on  Insulin Resistance and Prediabetes.    Idiopathic intracranial hypertension:   She has a history of pseudotumor cerebri but has not had symptoms in a number of years.  She was diagnosed with the condition about 15 years ago was evaluated at that time.  She initially had ophthalmic symptoms but these have essentially resolved.  She is not currently seeing a neurologist but is being managed by her PCP.  She is aware that weight loss can help this condition.     Plan: She will continue her plan and exercise.   She will follow-up with her PCP as needed.   Morbid Obesity: Current BMI BMI (Calculated): 44.63   Pharmacotherapy Plan Continue  Metformin  500 mg once daily breakfast  Megan Wade is currently in the action stage of change. As such, her goal is to continue with weight loss efforts.  She has agreed to the Category 4 plan and keeping a food journal with goal of 1,600 to 1,800  calories and 100 to 140  grams of protein daily.  Exercise goals: No exercise has been prescribed at this time. All adults should avoid inactivity. Some physical activity is better than none, and adults who participate in any amount of  physical activity gain some health benefits.  Behavioral modification strategies: increasing lean protein intake, decreasing simple carbohydrates , no meal skipping, meal planning , increase water intake, planning for success, increasing vegetables, increasing fiber rich foods, avoiding temptations, keep healthy foods in the home, weigh protein  portions, measure portion sizes, and mindful eating.  Megan Wade has agreed to follow-up with our clinic in 2 weeks.   No orders of the defined types were placed in this encounter.   Medications Discontinued During This Encounter  Medication Reason   acetaminophen  (TYLENOL ) 500 MG tablet Patient Preference   fluticasone  (FLONASE ) 50 MCG/ACT nasal spray Patient Preference   cetirizine  (ZYRTEC  ALLERGY) 10 MG tablet Patient Preference   amLODipine  (NORVASC ) 5 MG tablet Patient Preference     No orders of the defined types were placed in this encounter.     Labs reviewed today from last visit (Lipids, CBC, Vitamin D , and thyroid  panel).   Objective:   VITALS: Per patient if applicable, see vitals. GENERAL: Alert and in no acute distress. CARDIOPULMONARY: No increased WOB. Speaking in clear sentences.  PSYCH: Pleasant and cooperative. Speech normal rate and rhythm. Affect is appropriate. Insight and judgement are appropriate. Attention is focused, linear, and appropriate.  NEURO: Oriented as arrived to appointment on time with no prompting.   Attestation Statements:    This was prepared with the assistance of Engineer, civil (consulting).  Occasional wrong-word or sound-a-like substitutions may have occurred due to the inherent limitations of voice recognition software.   Clayborne Daring, DO

## 2023-10-22 DIAGNOSIS — F411 Generalized anxiety disorder: Secondary | ICD-10-CM | POA: Diagnosis not present

## 2023-10-28 DIAGNOSIS — F411 Generalized anxiety disorder: Secondary | ICD-10-CM | POA: Diagnosis not present

## 2023-10-29 ENCOUNTER — Encounter: Payer: Self-pay | Admitting: Nurse Practitioner

## 2023-10-29 ENCOUNTER — Ambulatory Visit (INDEPENDENT_AMBULATORY_CARE_PROVIDER_SITE_OTHER): Admitting: Nurse Practitioner

## 2023-10-29 VITALS — BP 143/100 | HR 93 | Temp 97.9°F | Ht 67.0 in | Wt 289.0 lb

## 2023-10-29 DIAGNOSIS — I10 Essential (primary) hypertension: Secondary | ICD-10-CM | POA: Diagnosis not present

## 2023-10-29 DIAGNOSIS — E66813 Obesity, class 3: Secondary | ICD-10-CM

## 2023-10-29 DIAGNOSIS — E88819 Insulin resistance, unspecified: Secondary | ICD-10-CM | POA: Diagnosis not present

## 2023-10-29 DIAGNOSIS — E559 Vitamin D deficiency, unspecified: Secondary | ICD-10-CM | POA: Diagnosis not present

## 2023-10-29 DIAGNOSIS — Z6841 Body Mass Index (BMI) 40.0 and over, adult: Secondary | ICD-10-CM

## 2023-10-29 MED ORDER — VITAMIN D (ERGOCALCIFEROL) 1.25 MG (50000 UNIT) PO CAPS: 50000.0000 [IU] | ORAL_CAPSULE | ORAL | 0 refills | Status: DC

## 2023-10-29 MED ORDER — METFORMIN HCL 500 MG PO TABS: 500.0000 mg | ORAL_TABLET | Freq: Every day | ORAL | 0 refills | Status: DC

## 2023-10-29 NOTE — Progress Notes (Signed)
 Office: [phone]  /  Fax: [phone]  WEIGHT SUMMARY AND BIOMETRICS  Weight Lost Since Last Visit: 0lb  Weight Gained Since Last Visit: 4lb   Vitals Temp: 97.9 F (36.6 C) BP: (!) 143/100 Pulse Rate: 93 SpO2: 96 %   Anthropometric Measurements Height: 5' 7 (1.702 m) Weight: 289 lb (131.1 kg) BMI (Calculated): 45.25 Weight at Last Visit: 285lb Weight Lost Since Last Visit: 0lb Weight Gained Since Last Visit: 4lb Starting Weight: 291lb Total Weight Loss (lbs): 2 lb (0.907 kg) Peak Weight: 300lb   Body Composition  Body Fat %: 49.7 % Fat Mass (lbs): 143.6 lbs Muscle Mass (lbs): 138 lbs Total Body Water (lbs): 104.8 lbs Visceral Fat Rating : 14   Other Clinical Data Fasting: No Labs: No Today's Visit #: 3 Starting Date: 10/01/23     HPI  Chief Complaint: OBESITY  Megan Wade is here to discuss her progress with her obesity treatment plan. She is on the the Category 4 Plan and states she is following her eating plan approximately 50 % of the time. She states she is exercising 0 minutes 0 days per week.   Interval History:  Since last office visit she has gained 4 pounds.  She was feeling things were going well until over the past 2 weeks. She had a death in the family.  The funeral is at the end of the month. She notes  a lot is going on.  Her cravings and polyphagia have improved since starting Metformin .  She has been eating more on a schedule. She is now eating 3 meals per day when prior to starting here she was eating 1-2 meals per day.  She is drinking water and sparkling water.     Pharmacotherapy for weight loss: She is not currently taking medications  for medical weight loss.     Previous pharmacotherapy for medical weight loss:  Phentermine-stopped for insomnia  Bariatric surgery:  Patient has not had bariatric surgery.    Hypertension Hypertension BP is elevated today. Patient reports she hasn't taken her meds today.   Medication(s):  Cozaar 50mg .  Denies side effects.   Denies chest pain, palpitations and SHOB.  BP Readings from Last 3 Encounters:  10/29/23 (!) 143/100  10/15/23 (!) 135/90  10/01/23 (!) 137/95   Lab Results  Component Value Date   CREATININE 0.76 06/24/2021   CREATININE 0.71 09/01/2020   CREATININE 0.84 06/11/2012    Insulin Resistance Last fasting insulin was 101 on 09/04/23 Polyphagia:Yes Medication(s): Metformin  500 mg once daily breakfast- notes some side effects of nausea.  Recently changed to evening and has helped some with nausea.  No results found for: HGBA1C No results found for: INSULIN   Vit D deficiency  She is taking Vit D 50,000 IU weekly.  Denies side effects.  Denies nausea, vomiting or muscle weakness.    Lab Results  Component Value Date   VD25OH 19.0 (L) 10/01/2023    PHYSICAL EXAM:  Blood pressure (!) 143/100, pulse 93, temperature 97.9 F (36.6 C), height 5' 7 (1.702 m), weight 289 lb (131.1 kg), SpO2 96%. Body mass index is 45.26 kg/m.  General: She is overweight, cooperative, alert, well developed, and in no acute distress. PSYCH: Has normal mood, affect and thought process.   Extremities: No edema.  Neurologic: No gross sensory or motor deficits. No tremors or fasciculations noted.    DIAGNOSTIC DATA REVIEWED:  BMET    Component Value Date/Time   NA 135 06/24/2021 1712   NA  CANCELED 06/11/2012 1618   K 3.8 06/24/2021 1712   CL 103 06/24/2021 1712   CO2 25 06/24/2021 1712   GLUCOSE 101 (H) 06/24/2021 1712   BUN 8 06/24/2021 1712   BUN 8 06/11/2012 1618   CREATININE 0.76 06/24/2021 1712   CALCIUM 9.4 06/24/2021 1712   GFRNONAA >60 06/24/2021 1712   GFRAA CANCELED 06/11/2012 1618   No results found for: HGBA1C No results found for: INSULIN Lab Results  Component Value Date   TSH 3.390 10/01/2023   CBC    Component Value Date/Time   WBC 4.9 10/01/2023 0953   WBC 6.5 06/24/2021 1712   RBC 4.28 10/01/2023 0953   RBC 4.74 06/24/2021  1712   HGB 12.7 10/01/2023 0953   HCT 39.5 10/01/2023 0953   PLT 270 10/01/2023 0953   MCV 92 10/01/2023 0953   MCH 29.7 10/01/2023 0953   MCH 29.7 06/24/2021 1712   MCHC 32.2 10/01/2023 0953   MCHC 33.7 06/24/2021 1712   RDW 14.4 10/01/2023 0953   Iron Studies No results found for: IRON, TIBC, FERRITIN, IRONPCTSAT Lipid Panel  No results found for: CHOL, TRIG, HDL, CHOLHDL, VLDL, LDLCALC, LDLDIRECT Hepatic Function Panel     Component Value Date/Time   PROT 7.1 05/05/2014 1223   PROT 8.1 06/11/2012 1618   ALBUMIN 4.2 05/05/2014 1223   ALBUMIN 4.5 06/11/2012 1618   AST 15 05/05/2014 1223   ALT 16 05/05/2014 1223   ALKPHOS 58 05/05/2014 1223   BILITOT 0.4 05/05/2014 1223   BILIDIR 0.1 05/05/2014 1223   IBILI 0.3 05/05/2014 1223      Component Value Date/Time   TSH 3.390 10/01/2023 0953   Nutritional Lab Results  Component Value Date   VD25OH 19.0 (L) 10/01/2023     ASSESSMENT AND PLAN  TREATMENT PLAN FOR OBESITY:  Recommended Dietary Goals  Idy is currently in the action stage of change. As such, her goal is to continue weight management plan. She has agreed to keeping a food journal and adhering to recommended goals of 1600-1800 calories and 100+ grams of  protein.  Behavioral Intervention  We discussed the following Behavioral Modification Strategies today: increasing lean protein intake to established goals, decreasing simple carbohydrates , increasing vegetables, increasing fiber rich foods, increasing water intake , work on meal planning and preparation, reading food labels , keeping healthy foods at home, continue to work on maintaining a reduced calorie state, getting the recommended amount of protein, incorporating whole foods, making healthy choices, staying well hydrated and practicing mindfulness when eating., and increase protein intake, fibrous foods (25 grams per day for women, 30 grams for men) and water to improve satiety and  decrease hunger signals. .  Additional resources provided today: NA  Recommended Physical Activity Goals  Stacie has been advised to work up to 150 minutes of moderate intensity aerobic activity a week and strengthening exercises 2-3 times per week for cardiovascular health, weight loss maintenance and preservation of muscle mass.   She has agreed to Think about enjoyable ways to increase daily physical activity and overcoming barriers to exercise, Increase physical activity in their day and reduce sedentary time (increase NEAT)., Work on scheduling and tracking physical activity. , and Combine aerobic and strengthening exercises for efficiency and improved cardiometabolic health.  Encouraged her to start walking and would like to add and some resistance training.  ASSOCIATED CONDITIONS ADDRESSED TODAY  Action/Plan  Insulin resistance -     Continue metFORMIN  HCl; Take 1 tablet (500 mg total)  by mouth daily with breakfast.  Dispense: 30 tablet; Refill: 0.  Side effects discussed.  We discussed changing her metformin  to extended release but she would like to continue taking metformin  at night.  She recently switched to taking at night due to side effects-feels that her side effects have decreased since changing the dose tonight.  Will discuss again at her next visit.  Vitamin D  deficiency -     Vitamin D  (Ergocalciferol ); Take 1 capsule (50,000 Units total) by mouth every 7 (seven) days.  Dispense: 5 capsule; Refill: 0  Low Vitamin D  level contributes to fatigue and are associated with obesity, breast, and colon cancer. She agrees to continue to take prescription Vitamin D  @50 ,000 IU every week and will follow-up for routine testing of Vitamin D , at least 2-3 times per year to avoid over-replacement.   Hypertension BP is elevated today.  She has not taken her medications today as directed.  I encouraged her to take her medications as directed on a daily basis.  Her BP has been elevated on  multiple visits.  She needs to see her PCP for follow-up to discuss additional treatment options.   Class 3 severe obesity due to excess calories with serious comorbidity and body mass index (BMI) of 40.0 to 44.9 in adult         Return in about 3 weeks (around 11/19/2023).SABRA She was informed of the importance of frequent follow up visits to maximize her success with intensive lifestyle modifications for her multiple health conditions.   ATTESTASTION STATEMENTS:  Reviewed by clinician on day of visit: allergies, medications, problem list, medical history, surgical history, family history, social history, and previous encounter notes.     Corean SAUNDERS. Mercie Balsley FNP-C

## 2023-11-04 ENCOUNTER — Other Ambulatory Visit: Payer: Self-pay

## 2023-11-04 DIAGNOSIS — F411 Generalized anxiety disorder: Secondary | ICD-10-CM | POA: Diagnosis not present

## 2023-11-04 DIAGNOSIS — E88819 Insulin resistance, unspecified: Secondary | ICD-10-CM

## 2023-11-04 MED ORDER — METFORMIN HCL 500 MG PO TABS: 500.0000 mg | ORAL_TABLET | Freq: Every day | ORAL | 0 refills | Status: DC

## 2023-11-18 DIAGNOSIS — F411 Generalized anxiety disorder: Secondary | ICD-10-CM | POA: Diagnosis not present

## 2023-11-20 ENCOUNTER — Encounter: Payer: Self-pay | Admitting: Bariatrics

## 2023-11-20 ENCOUNTER — Ambulatory Visit (INDEPENDENT_AMBULATORY_CARE_PROVIDER_SITE_OTHER): Admitting: Bariatrics

## 2023-11-20 VITALS — BP 155/121 | HR 84 | Temp 97.8°F | Ht 67.0 in | Wt 287.0 lb

## 2023-11-20 DIAGNOSIS — E559 Vitamin D deficiency, unspecified: Secondary | ICD-10-CM

## 2023-11-20 DIAGNOSIS — I10 Essential (primary) hypertension: Secondary | ICD-10-CM

## 2023-11-20 DIAGNOSIS — Z6841 Body Mass Index (BMI) 40.0 and over, adult: Secondary | ICD-10-CM

## 2023-11-20 MED ORDER — VITAMIN D (ERGOCALCIFEROL) 1.25 MG (50000 UNIT) PO CAPS: 50000.0000 [IU] | ORAL_CAPSULE | ORAL | 0 refills | Status: DC

## 2023-11-20 NOTE — Progress Notes (Signed)
 WEIGHT SUMMARY AND BIOMETRICS  Weight Lost Since Last Visit: 2lb  Weight Gained Since Last Visit: 0   Vitals Temp: 97.8 F (36.6 C) BP: (!) 155/121 Pulse Rate: 84 SpO2: 100 %   Anthropometric Measurements Height: 5' 7 (1.702 m) Weight: 287 lb (130.2 kg) BMI (Calculated): 44.94 Weight at Last Visit: 289lb Weight Lost Since Last Visit: 2lb Weight Gained Since Last Visit: 0 Starting Weight: 291lb Total Weight Loss (lbs): 4 lb (1.814 kg) Peak Weight: 300lb   Body Composition  Body Fat %: 49.9 % Fat Mass (lbs): 143.4 lbs Muscle Mass (lbs): 136.8 lbs Total Body Water (lbs): 104.4 lbs Visceral Fat Rating : 14   Other Clinical Data Fasting: no Labs: no Today's Visit #: 4 Starting Date: 10/01/23    OBESITY Megan Wade is here to discuss her progress with her obesity treatment plan along with follow-up of her obesity related diagnoses.    Nutrition Plan: the Category 4 plan - 75% adherence.  Current exercise: home exercises.  Interim History:  She is down 2 lbs since her last visit.  Protein intake is as prescribed, Is skipping meals, and Water intake is adequate. She has been doing some journaling and using an app ([employer]).    Pharmacotherapy: Megan Wade is on Metformin  500 mg once daily breakfast Adverse side effects: Nausea Hunger is fluctuating Cravings are well controlled.  Assessment/Plan:   Vitamin D  Deficiency Vitamin D  is not at goal of 50.  Most recent vitamin D  level was 19..0. She is on  prescription ergocalciferol  50,000 IU weekly. Lab Results  Component Value Date   VD25OH 19.0 (L) 10/01/2023    Plan: Refill prescription vitamin D  50,000 IU weekly.    Hypertension Hypertension loss of control due to intercurrent illness, She states that she had been sick last week and did not take her blood pressure medication during this time.  She  states that this is the first time that she is taking her blood pressure medicine in over a week.  She also states that she supposed to take her losartan twice daily but is only taking it daily.  She is under more stress from being out of work.  Family history of high blood pressure.  She took her blood pressure meds this am.  Medication(s): Losartan 50 mg twice daily per patient, started this year.   BP Readings from Last 3 Encounters:  11/20/23 (!) 155/121  10/29/23 (!) 143/100  10/15/23 (!) 135/90   Lab Results  Component Value Date   CREATININE 0.76 06/24/2021   CREATININE 0.71 09/01/2020   CREATININE 0.84 06/11/2012   No results found for: GFR  Plan: Continue all antihypertensives at current dosages. No added salt. Will keep sodium content to 1,500 mg or less per day.   She will resume her losartan on a regular basis and will take it as directed 50 mg  twice daily. She will check her blood pressure at home and will record it and bring those readings in for me in 2 weeks.  She will go to the emergency room if her blood pressure is over 180/120.  She will decrease her vaping.    Morbid Obesity: Current BMI BMI (Calculated): 44.94  Will track her protein and calories.   Pharmacotherapy Plan Continue  Metformin  500 mg once daily breakfast  Megan Wade is currently in the action stage of change. As such, her goal is to continue with weight loss efforts.  She has agreed to the Category 3 plan.  Exercise goals: All adults should avoid inactivity. Some physical activity is better than none, and adults who participate in any amount of physical activity gain some health benefits. She is using an [employer] app for exercise.   Behavioral modification strategies: increasing lean protein intake, meal planning , increase water intake, better snacking choices, planning for success, increasing vegetables, avoiding temptations, keep healthy foods in the home, measure portion sizes, work on smaller  portions, and mindful eating.  Megan Wade has agreed to follow-up with our clinic in 2 weeks.     Objective:   VITALS: Per patient if applicable, see vitals. GENERAL: Alert and in no acute distress. CARDIOPULMONARY: No increased WOB. Speaking in clear sentences.  PSYCH: Pleasant and cooperative. Speech normal rate and rhythm. Affect is appropriate. Insight and judgement are appropriate. Attention is focused, linear, and appropriate.  NEURO: Oriented as arrived to appointment on time with no prompting.   Attestation Statements:   This was prepared with the assistance of Engineer, civil (consulting).  Occasional wrong-word or sound-a-like substitutions may have occurred due to the inherent limitations of voice recognition   Clayborne Daring, DO

## 2023-12-04 ENCOUNTER — Ambulatory Visit (INDEPENDENT_AMBULATORY_CARE_PROVIDER_SITE_OTHER): Admitting: Bariatrics

## 2023-12-04 ENCOUNTER — Encounter: Payer: Self-pay | Admitting: Bariatrics

## 2023-12-04 VITALS — BP 163/105 | HR 85 | Temp 98.4°F | Ht 67.0 in | Wt 287.0 lb

## 2023-12-04 DIAGNOSIS — E88819 Insulin resistance, unspecified: Secondary | ICD-10-CM

## 2023-12-04 DIAGNOSIS — I10 Essential (primary) hypertension: Secondary | ICD-10-CM

## 2023-12-04 DIAGNOSIS — Z6841 Body Mass Index (BMI) 40.0 and over, adult: Secondary | ICD-10-CM | POA: Diagnosis not present

## 2023-12-04 MED ORDER — METFORMIN HCL 500 MG PO TABS: 500.0000 mg | ORAL_TABLET | Freq: Every day | ORAL | 0 refills | Status: DC

## 2023-12-04 MED ORDER — CHLORTHALIDONE 25 MG PO TABS: 25.0000 mg | ORAL_TABLET | Freq: Every day | ORAL | 0 refills | Status: DC

## 2023-12-04 NOTE — Progress Notes (Signed)
 WEIGHT SUMMARY AND BIOMETRICS  Weight Lost Since Last Visit: 0  Weight Gained Since Last Visit: 0   Vitals Temp: 98.4 F (36.9 C) BP: (!) 163/105 Pulse Rate: 85 SpO2: 99 %   Anthropometric Measurements Height: 5' 7 (1.702 m) Weight: 287 lb (130.2 kg) BMI (Calculated): 44.94 Weight at Last Visit: 287lb Weight Lost Since Last Visit: 0 Weight Gained Since Last Visit: 0 Starting Weight: 291lb Total Weight Loss (lbs): 4 lb (1.814 kg) Peak Weight: 300lb   Body Composition  Body Fat %: 45.9 % Fat Mass (lbs): 132 lbs Muscle Mass (lbs): 147.6 lbs Total Body Water (lbs): 105 lbs Visceral Fat Rating : 13   Other Clinical Data Fasting: no Labs: no Today's Visit #: 5 Starting Date: 10/01/23    OBESITY Citlalic is here to discuss her progress with her obesity treatment plan along with follow-up of her obesity related diagnoses.    Nutrition Plan: the Category 4 plan - 80-90% adherence.  Current exercise: Home exercises/workout videos  Interim History:  Her weight remains the same.  Eating all of the food on the plan., Protein intake is as prescribed, Is skipping meals, and Water intake is adequate.   Pharmacotherapy: Valeen is on Metformin  500 mg once daily breakfast Adverse side effects: None Hunger is moderately controlled.  Cravings are moderately controlled. For sweets mainly.  Assessment/Plan:    Insulin Resistance Reeve has had elevated fasting insulin readings. Goal is HgbA1c < 5.7, fasting insulin at l0 or less, and preferably at 5.  She denies excessive polyphagia. Medication(s): Metformin  No results found for: HGBA1C No results found for: INSULIN  Plan Medication(s): Metformin  500 mg once daily breakfast Will work on the agreed upon plan. Will minimize refined carbohydrates ( sweets and starches), and focus more on complex  carbohydrates.  Increase the micronutrients found in leafy greens, which include magnesium, polyphenols, and vitamin C which have been postulated to help with insulin sensitivity. Minimize fast food and cook more meals at home.  Increase fiber to 25 to 30 grams daily.   Hypertension Hypertension poorly controlled. Her blood pressures at home are 132 to 170 systolic and 90's diastolic.  Medication(s): Cozaar 50 mg daily   BP Readings from Last 3 Encounters:  12/04/23 (!) 163/105  11/20/23 (!) 155/121  10/29/23 (!) 143/100   Lab Results  Component Value Date   CREATININE 0.76 06/24/2021   CREATININE 0.71 09/01/2020   CREATININE 0.84 06/11/2012    Plan: Will add Chlorthalidone 25 mg daily # 30 with no refills, and continue the Cozaar.  Will check her blood pressure at home and record.  Will continue to cut back on her vaping.  No added salt. Will keep sodium content to 1,500 mg or less per day.     Morbid Obesity: Current BMI BMI (Calculated): 44.94   Pharmacotherapy Plan Continue and refill  Metformin  500  mg once daily breakfast  Akacia is currently in the action stage of change. As such, her goal is to continue with weight loss efforts.  She has agreed to keeping a food journal with goal of 1,300 to 1,400  calories and 100 grams of protein daily.  Exercise goals: For substantial health benefits, adults should do at least 150 minutes (2 hours and 30 minutes) a week of moderate-intensity, or 75 minutes (1 hour and 15 minutes) a week of vigorous-intensity aerobic physical activity, or an equivalent combination of moderate- and vigorous-intensity aerobic activity. Aerobic activity should be performed in episodes of at least 10 minutes, and preferably, it should be spread throughout the week.  Behavioral modification strategies: increasing lean protein intake, no meal skipping, meal planning , decrease liquid calories, increase water intake, better snacking choices, planning for  success, increasing vegetables, weigh protein portions, measure portion sizes, and mindful eating.  Riham has agreed to follow-up with our clinic in 3 weeks.    Objective:   VITALS: Per patient if applicable, see vitals. GENERAL: Alert and in no acute distress. CARDIOPULMONARY: No increased WOB. Speaking in clear sentences.  PSYCH: Pleasant and cooperative. Speech normal rate and rhythm. Affect is appropriate. Insight and judgement are appropriate. Attention is focused, linear, and appropriate.  NEURO: Oriented as arrived to appointment on time with no prompting.   Attestation Statements:   This was prepared with the assistance of Engineer, civil (consulting).  Occasional wrong-word or sound-a-like substitutions may have occurred due to the inherent limitations of voice recognition   Clayborne Daring, DO

## 2023-12-09 DIAGNOSIS — F411 Generalized anxiety disorder: Secondary | ICD-10-CM | POA: Diagnosis not present

## 2023-12-18 ENCOUNTER — Encounter: Payer: Self-pay | Admitting: Bariatrics

## 2023-12-18 ENCOUNTER — Ambulatory Visit: Admitting: Bariatrics

## 2023-12-18 VITALS — BP 139/95 | HR 79 | Temp 98.0°F | Ht 67.0 in | Wt 289.0 lb

## 2023-12-18 DIAGNOSIS — I10 Essential (primary) hypertension: Secondary | ICD-10-CM | POA: Diagnosis not present

## 2023-12-18 DIAGNOSIS — Z6841 Body Mass Index (BMI) 40.0 and over, adult: Secondary | ICD-10-CM | POA: Diagnosis not present

## 2023-12-18 DIAGNOSIS — E559 Vitamin D deficiency, unspecified: Secondary | ICD-10-CM

## 2023-12-18 MED ORDER — VITAMIN D (ERGOCALCIFEROL) 1.25 MG (50000 UNIT) PO CAPS: 50000.0000 [IU] | ORAL_CAPSULE | ORAL | 0 refills | Status: DC

## 2023-12-18 NOTE — Progress Notes (Signed)
 WEIGHT SUMMARY AND BIOMETRICS  Weight Lost Since Last Visit: 0lb  Weight Gained Since Last Visit: 2lb   Vitals Temp: 98 F (36.7 C) BP: (!) 139/95 Pulse Rate: 79 SpO2: 99 %   Anthropometric Measurements Height: 5' 7 (1.702 m) Weight: 289 lb (131.1 kg) BMI (Calculated): 45.25 Weight at Last Visit: 287lb Weight Lost Since Last Visit: 0lb Weight Gained Since Last Visit: 2lb Starting Weight: 291lb Total Weight Loss (lbs): 2 lb (0.907 kg)   Body Composition  Body Fat %: 51.7 % Fat Mass (lbs): 149.6 lbs Muscle Mass (lbs): 132.6 lbs Total Body Water (lbs): 109.4 lbs Visceral Fat Rating : 14   Other Clinical Data Fasting: No Labs: No Today's Visit #: 6 Starting Date: 10/01/23    OBESITY Megan Wade is here to discuss her progress with her obesity treatment plan along with follow-up of her obesity related diagnoses.    Nutrition Plan: keeping a food journal with goal of 1300 calories and 100 grams of protein daily - 80-90% adherence.  Current exercise: walking  Interim History:  She is up 2 lbs since her last visit.  Eating all of the food on the plan., Protein intake is less than prescribed., Is skipping meals, and Water intake is adequate.   Pharmacotherapy: Megan Wade is on Metformin  500 mg once daily breakfast Adverse side effects: None Hunger is well controlled.  Cravings are well controlled.  Assessment/Plan:   Hypertension Hypertension poorly controlled. She has just started the Chlorthalidone last week. She is cutting back on her vaping.  Medication(s): Chlorthalidone 25 mg, Losartan 50 mg   BP Readings from Last 3 Encounters:  12/18/23 (!) 139/95  12/04/23 (!) 163/105  11/20/23 (!) 155/121   Lab Results  Component Value Date   CREATININE 0.76 06/24/2021   CREATININE 0.71 09/01/2020   CREATININE 0.84 06/11/2012   No results found for:  GFR  Plan: Continue all antihypertensives at current dosages. No added salt. Will keep sodium content to 1,500 mg or less per day.   Will refrain from vaping.  Will continue to journal using [employer] Pal.   Vitamin D  Deficiency Vitamin D  is not at goal of 50.  Most recent vitamin D  level was 19. She is on  prescription ergocalciferol  50,000 IU weekly. Lab Results  Component Value Date   VD25OH 19.0 (L) 10/01/2023    Plan: Refill prescription vitamin D  50,000 IU weekly.     Morbid Obesity: Current BMI BMI (Calculated): 45.25   Pharmacotherapy Plan Metformin  500 mg 1 in the am.   Megan Wade is currently in the action stage of change. As such, her goal is to continue with weight loss efforts.  She has agreed to keeping a food journal with goal of 1,300  calories and 80 to 90 grams of protein daily.  Exercise goals: All adults should avoid inactivity. Some physical activity is better than  none, and adults who participate in any amount of physical activity gain some health benefits. She will increase her exercise. She will consider swimming and weights. She will get back into the gym.   Behavioral modification strategies: increasing lean protein intake, no meal skipping, meal planning , increase water intake, better snacking choices, increasing vegetables, avoiding temptations, keep healthy foods in the home, pack lunch for work, and mindful eating.  Megan Wade has agreed to follow-up with our clinic in 4 weeks.    Objective:   VITALS: Per patient if applicable, see vitals. GENERAL: Alert and in no acute distress. CARDIOPULMONARY: No increased WOB. Speaking in clear sentences.  PSYCH: Pleasant and cooperative. Speech normal rate and rhythm. Affect is appropriate. Insight and judgement are appropriate. Attention is focused, linear, and appropriate.  NEURO: Oriented as arrived to appointment on time with no prompting.   Attestation Statements:   This was prepared with the  assistance of Engineer, Civil (consulting).  Occasional wrong-word or sound-a-like substitutions may have occurred due to the inherent limitations of voice recognition   Clayborne Daring, DO

## 2023-12-23 DIAGNOSIS — F411 Generalized anxiety disorder: Secondary | ICD-10-CM | POA: Diagnosis not present

## 2024-01-06 DIAGNOSIS — F411 Generalized anxiety disorder: Secondary | ICD-10-CM | POA: Diagnosis not present

## 2024-01-08 ENCOUNTER — Other Ambulatory Visit: Payer: Self-pay | Admitting: Bariatrics

## 2024-01-08 DIAGNOSIS — E88819 Insulin resistance, unspecified: Secondary | ICD-10-CM

## 2024-01-21 ENCOUNTER — Ambulatory Visit: Admitting: Bariatrics

## 2024-02-10 ENCOUNTER — Encounter: Payer: Self-pay | Admitting: Bariatrics

## 2024-02-10 ENCOUNTER — Encounter: Admitting: Bariatrics

## 2024-02-10 NOTE — Progress Notes (Unsigned)
" ° ° °                                                                                                     °    ° °  WEIGHT SUMMARY AND BIOMETRICS  Weight Lost Since Last Visit: 1lb  Weight Gained Since Last Visit: 0   Vitals BP: 139/82 Pulse Rate: 77 SpO2: 100 %   Anthropometric Measurements Height: 5' 7 (1.702 m) Weight: 288 lb (130.6 kg) BMI (Calculated): 45.1 Weight at Last Visit: 289lb Weight Lost Since Last Visit: 1lb Weight Gained Since Last Visit: 0 Starting Weight: 291lb Total Weight Loss (lbs): 3 lb (1.361 kg)   Body Composition  Body Fat %: 49.4 % Fat Mass (lbs): 142.6 lbs Muscle Mass (lbs): 138.8 lbs Total Body Water (lbs): 105 lbs Visceral Fat Rating : 14   Other Clinical Data Fasting: no Labs: no Today's Visit #: 7 Starting Date: 10/01/23    OBESITY Megan Wade is here to discuss her progress with her obesity treatment plan along with follow-up of her obesity related diagnoses.    Nutrition Plan: keeping a food journal with goal of 1300 calories and 100 grams of protein daily - 80% adherence.  Current exercise: none  Interim History:  *** {aabnutritionassessment:29213}   Pharmacotherapy: Megan Wade is on {dwwpharmacotherapy:29109} Adverse side effects: {dwwse:29122} Hunger is {EWCONTROLASSESSMENT:24261}.  Cravings are {EWCONTROLASSESSMENT:24261}.  Assessment/Plan:   There are no diagnoses linked to this encounter.    {dwwmorbid:29108::Morbid Obesity}: Current BMI BMI (Calculated): 45.1   Pharmacotherapy Plan {dwwmed:29123}  {dwwpharmacotherapy:29109}  Megan Wade {CHL AMB IS/IS NOT:210130109} currently in the action stage of change. As such, her goal is to {MWMwtloss#1:210800005}.  She has agreed to {dwwsldiets:29085}.  Exercise goals: {MWM EXERCISE RECS:23473}  Behavioral modification strategies: {dwwslwtlossstrategies:29088}.  Megan Wade has agreed to follow-up with our clinic in {NUMBER 1-10:22536} weeks.   No orders of the defined types were  placed in this encounter.   There are no discontinued medications.   No orders of the defined types were placed in this encounter.     Objective:   VITALS: Per patient if applicable, see vitals. GENERAL: Alert and in no acute distress. CARDIOPULMONARY: No increased WOB. Speaking in clear sentences.  PSYCH: Pleasant and cooperative. Speech normal rate and rhythm. Affect is appropriate. Insight and judgement are appropriate. Attention is focused, linear, and appropriate.  NEURO: Oriented as arrived to appointment on time with no prompting.   Attestation Statements:   This was prepared with the assistance of Engineer, Civil (consulting).  Occasional wrong-word or sound-a-like substitutions may have occurred due to the inherent limitations of voice recognition    "

## 2024-02-11 NOTE — Progress Notes (Signed)
 This encounter was created in error - please disregard.

## 2024-02-12 ENCOUNTER — Ambulatory Visit: Admitting: Bariatrics

## 2024-02-12 ENCOUNTER — Encounter: Payer: Self-pay | Admitting: Bariatrics

## 2024-02-12 VITALS — BP 130/92 | HR 86 | Temp 98.1°F | Ht 67.0 in | Wt 287.0 lb

## 2024-02-12 DIAGNOSIS — I10 Essential (primary) hypertension: Secondary | ICD-10-CM

## 2024-02-12 DIAGNOSIS — E559 Vitamin D deficiency, unspecified: Secondary | ICD-10-CM | POA: Diagnosis not present

## 2024-02-12 DIAGNOSIS — Z6841 Body Mass Index (BMI) 40.0 and over, adult: Secondary | ICD-10-CM | POA: Diagnosis not present

## 2024-02-12 MED ORDER — LOSARTAN POTASSIUM 100 MG PO TABS: 100.0000 mg | ORAL_TABLET | Freq: Every day | ORAL | 0 refills | Status: AC

## 2024-02-12 MED ORDER — METFORMIN HCL ER 500 MG PO TB24: 500.0000 mg | ORAL_TABLET | Freq: Every day | ORAL | 0 refills | Status: AC

## 2024-02-12 MED ORDER — CHLORTHALIDONE 25 MG PO TABS: ORAL_TABLET | ORAL | 0 refills | Status: AC

## 2024-02-12 MED ORDER — VITAMIN D (ERGOCALCIFEROL) 1.25 MG (50000 UNIT) PO CAPS: 50000.0000 [IU] | ORAL_CAPSULE | ORAL | 0 refills | Status: AC

## 2024-02-12 NOTE — Progress Notes (Signed)
 "                                                                                                             WEIGHT SUMMARY AND BIOMETRICS  Weight Lost Since Last Visit: 1lb  Weight Gained Since Last Visit: 0lb   Vitals Temp: 98.1 F (36.7 C) BP: (!) 130/92 Pulse Rate: 86 SpO2: 100 %   Anthropometric Measurements Height: 5' 7 (1.702 m) Weight: 287 lb (130.2 kg) BMI (Calculated): 44.94 Weight at Last Visit: 288lb Weight Lost Since Last Visit: 1lb Weight Gained Since Last Visit: 0lb Starting Weight: 291lb Total Weight Loss (lbs): 3 lb (1.361 kg)   Body Composition  Body Fat %: 49.5 % Fat Mass (lbs): 142.2 lbs Muscle Mass (lbs): 137.8 lbs Total Body Water (lbs): 103.6 lbs Visceral Fat Rating : 14   Other Clinical Data Fasting: no Labs: no Today's Visit #: 7 Starting Date: 10/01/23    OBESITY Megan Wade is here to discuss her progress with her obesity treatment plan along with follow-up of her obesity related diagnoses.    Nutrition Plan: keeping a food journal with goal of 1300 calories and 100 grams of protein daily - 60% adherence.  Current exercise: none  Interim History:  She is down 1 lb since her last visit.  Eating all of the food on the plan., Protein intake is as prescribed, Is not skipping meals, Journaling consistently., and Water intake is adequate.   Pharmacotherapy: Megan Wade is on Metformin  500 mg once daily breakfast Hunger is moderately controlled.  Cravings are moderately controlled.  Assessment/Plan:   Vitamin D  Deficiency Vitamin D  is at goal of 50.  Most recent vitamin D  level was 19.0. She is on  prescription ergocalciferol  50,000 IU weekly. Lab Results  Component Value Date   VD25OH 19.0 (L) 10/01/2023    Plan: Continue prescription vitamin D  50,000 IU weekly.   Hypertension Hypertension control uncertain.  She did vamp before her visit.  She did not take her blood pressure medication today.  Medication(s): Chlorthalidone   25 mg and Cozaar  50 mg daily   BP Readings from Last 3 Encounters:  02/12/24 (!) 130/92  12/18/23 (!) 139/95  12/04/23 (!) 163/105   Lab Results  Component Value Date   CREATININE 0.76 06/24/2021   CREATININE 0.71 09/01/2020   CREATININE 0.84 06/11/2012   Repeat BP 130/90  Plan: Will increase her Cozaar  50 mg to 100 mg.  Rx: Cozaar  100 mg 1 po daily # 30 with no refills.  No added salt. Will keep sodium content to 1,500 mg or less per day.   Will minimize or stop vaping.     Morbid Obesity: Current BMI BMI (Calculated): 44.94   Pharmacotherapy Plan Change to Metformin  500 mg XR 1 daily # 30 with no refills (stomach upset).   Megan Wade is currently in the action stage of change. As such, her goal is to continue with weight loss efforts.  She has agreed to keeping a food journal with goal of 1,300 calories and 90 grams of protein daily.  Exercise goals: All adults should avoid inactivity. Some physical activity is better than none, and adults who participate in any amount of physical activity gain some health benefits. She will be more consistent with her exercise.   Behavioral modification strategies: increasing lean protein intake, no meal skipping, meal planning , increase water intake, better snacking choices, planning for success, increasing vegetables, increasing fiber rich foods, and decrease snacking .  Megan Wade has agreed to follow-up with our clinic in 4 weeks.    Objective:   VITALS: Per patient if applicable, see vitals. GENERAL: Alert and in no acute distress. CARDIOPULMONARY: No increased WOB. Speaking in clear sentences.  PSYCH: Pleasant and cooperative. Speech normal rate and rhythm. Affect is appropriate. Insight and judgement are appropriate. Attention is focused, linear, and appropriate.  NEURO: Oriented as arrived to appointment on time with no prompting.   Attestation Statements:   This was prepared with the assistance of Engineer, Civil (consulting).   Occasional wrong-word or sound-a-like substitutions may have occurred due to the inherent limitations of voice recognition.   Clayborne Daring, DO     "

## 2024-03-11 ENCOUNTER — Ambulatory Visit: Payer: Self-pay | Admitting: Bariatrics

## 2024-03-26 ENCOUNTER — Other Ambulatory Visit: Payer: Self-pay | Admitting: Bariatrics

## 2024-03-26 DIAGNOSIS — E559 Vitamin D deficiency, unspecified: Secondary | ICD-10-CM

## 2024-04-13 ENCOUNTER — Ambulatory Visit: Admitting: Bariatrics
